# Patient Record
Sex: Female | Born: 1975 | Race: White | Hispanic: No | Marital: Married | Smoking: Never smoker
Health system: Southern US, Community
[De-identification: ages and names within clinical notes are randomized; demographics above are authoritative.]

## PROBLEM LIST (undated history)

## (undated) DIAGNOSIS — H269 Unspecified cataract: Secondary | ICD-10-CM

## (undated) DIAGNOSIS — H43399 Other vitreous opacities, unspecified eye: Secondary | ICD-10-CM

## (undated) DIAGNOSIS — H04129 Dry eye syndrome of unspecified lacrimal gland: Secondary | ICD-10-CM

## (undated) DIAGNOSIS — H40009 Preglaucoma, unspecified, unspecified eye: Secondary | ICD-10-CM

## (undated) DIAGNOSIS — J45909 Unspecified asthma, uncomplicated: Secondary | ICD-10-CM

## (undated) DIAGNOSIS — G2579 Other drug induced movement disorders: Secondary | ICD-10-CM

## (undated) DIAGNOSIS — E785 Hyperlipidemia, unspecified: Secondary | ICD-10-CM

## (undated) DIAGNOSIS — F329 Major depressive disorder, single episode, unspecified: Secondary | ICD-10-CM

## (undated) HISTORY — DX: Major depressive disorder, single episode, unspecified: F32.9

## (undated) HISTORY — DX: Unspecified asthma, uncomplicated: J45.909

## (undated) HISTORY — DX: Hyperlipidemia, unspecified: E78.5

## (undated) HISTORY — PX: OTHER SURGICAL HISTORY: SHX169

## (undated) HISTORY — DX: Other drug induced movement disorders: G25.79

## (undated) HISTORY — DX: Preglaucoma, unspecified, unspecified eye: H40.009

## (undated) HISTORY — DX: Unspecified cataract: H26.9

## (undated) HISTORY — DX: Dry eye syndrome of unspecified lacrimal gland: H04.129

## (undated) HISTORY — DX: Other vitreous opacities, unspecified eye: H43.399

## (undated) MED ORDER — METOPROLOL TARTRATE 5 MG/5ML IV SOLN
5.0000 mg | INTRAVENOUS | Status: AC | PRN
Start: 2021-09-19 — End: ?

## (undated) MED ORDER — PERFLUTREN LIPID MICROSPHERE 1.1 MG/ML IV SUSP
0.4000 mL | Freq: Once | INTRAVENOUS | Status: AC
Start: 2021-02-03 — End: 2021-02-03

## (undated) MED ORDER — NITROGLYCERIN 0.4 MG SL SUBL
0.4000 mg | SUBLINGUAL_TABLET | SUBLINGUAL | Status: AC | PRN
Start: 2021-09-19 — End: ?

## (undated) MED ORDER — SODIUM CHLORIDE FLUSH 0.9 % IV SOLN
10.0000 mL | Freq: Once | INTRAVENOUS | Status: AC
Start: 2021-02-03 — End: 2021-02-03

---

## 1997-01-15 HISTORY — PX: NASAL SINUS SURGERY: SHX719

## 2010-02-13 LAB — PULMONARY FUNCTION TEST

## 2011-12-21 LAB — VITAMIN D 1,25 DIHYDROXY: Vit D, 25-Hydroxy: 26.2

## 2011-12-21 LAB — BASIC METABOLIC PANEL: Creatinine: 0.8 mg/dL (ref 0.5–1.1)

## 2011-12-21 LAB — CBC AND DIFFERENTIAL
HCT: 42 % (ref 36–46)
Hemoglobin: 14.3 g/dL (ref 12.0–16.0)
Platelets: 263 10*3/uL (ref 150–399)

## 2011-12-21 LAB — TSH: TSH: 1.15 u[IU]/mL (ref 0.41–5.90)

## 2012-08-05 ENCOUNTER — Encounter: Payer: Self-pay | Admitting: Family Medicine

## 2012-08-05 ENCOUNTER — Ambulatory Visit (INDEPENDENT_AMBULATORY_CARE_PROVIDER_SITE_OTHER): Payer: 59 | Admitting: Family Medicine

## 2012-08-05 VITALS — BP 112/69 | HR 60 | Temp 98.1°F | Ht 65.5 in | Wt 147.0 lb

## 2012-08-05 DIAGNOSIS — G2579 Other drug induced movement disorders: Secondary | ICD-10-CM

## 2012-08-05 DIAGNOSIS — A499 Bacterial infection, unspecified: Secondary | ICD-10-CM

## 2012-08-05 DIAGNOSIS — G9081 Serotonin syndrome: Secondary | ICD-10-CM

## 2012-08-05 DIAGNOSIS — J329 Chronic sinusitis, unspecified: Secondary | ICD-10-CM

## 2012-08-05 DIAGNOSIS — B9689 Other specified bacterial agents as the cause of diseases classified elsewhere: Secondary | ICD-10-CM

## 2012-08-05 DIAGNOSIS — N63 Unspecified lump in unspecified breast: Secondary | ICD-10-CM

## 2012-08-05 DIAGNOSIS — N6310 Unspecified lump in the right breast, unspecified quadrant: Secondary | ICD-10-CM | POA: Insufficient documentation

## 2012-08-05 DIAGNOSIS — B373 Candidiasis of vulva and vagina: Secondary | ICD-10-CM

## 2012-08-05 DIAGNOSIS — B3731 Acute candidiasis of vulva and vagina: Secondary | ICD-10-CM

## 2012-08-05 HISTORY — DX: Serotonin syndrome: G90.81

## 2012-08-05 HISTORY — DX: Other drug induced movement disorders: G25.79

## 2012-08-05 MED ORDER — FLUCONAZOLE 150 MG PO TABS: ORAL_TABLET | ORAL | Status: AC

## 2012-08-05 MED ORDER — AMOXICILLIN-POT CLAVULANATE 500-125 MG PO TABS: ORAL_TABLET | ORAL | Status: DC

## 2012-08-05 NOTE — Progress Notes (Signed)
CC: Dana Gomez is a 37 y.o. 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 Is been worsening on a daily basis. Slightly improved with Afrin and Alka-Seltzer nothing else makes better or worse. She feels fatigued but denies fevers, chills nor motor or sensory disturbances  Patient complains of a mass found in her right breast approximately 3-4 weeks ago. It is slightly tender localized medially near the breast bone she is never felt this before. Family history of breast cancer in grandmother no ovarian or breast cancer in immediate family. She denies unintentional weight loss nor swollen lymph nodes. She denies breast architectural changes, nipple discharge, retraction, nor skin changes anywhere in the right breast no interventions as of yet   Review Of Systems Outlined In HPI  Past Medical History  Diagnosis Date  . 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 Breast cancer Maternal Grandmother   . Hyperlipidemia Mother      History  Substance Use Topics  . Smoking status: Never Smoker   . Smokeless tobacco: Not on file  . Alcohol Use: Yes     Objective: Filed Vitals:   08/05/12 1432  BP: 112/69  Pulse: 60  Temp: 98.1 F (36.7 C)    General: Alert and Oriented, No Acute Distress HEENT: Pupils equal, round, reactive to light. Conjunctivae clear.  External ears unremarkable, canals clear with intact TMs with appropriate landmarks.  Middle ear appears open without effusion. Boggy inferior turbinates.  Moist mucous membranes, pharynx without inflammation nor lesions.  Neck supple without  palpable lymphadenopathy nor abnormal masses. Left maxillary sinus tenderness to percussion Lungs: Clear to auscultation bilaterally, no wheezing/ronchi/rales.  Comfortable work of breathing. Good air movement. Cardiac: Regular rate and rhythm. Normal S1/S2.  No murmurs, rubs, nor gallops.   Breast: Dana Gomez chaperone present. Right axilla and supraclavicular are without palpable lymphadenopathy. At approximately 3 fingerbreadths medially from the nipple on the right breast there is a approximately half centimeter slightly tender mobile firm mass superficial to one of the rib heads without overlying skin changes Extremities: No peripheral edema.  Strong peripheral pulses.  Mental Status: No depression, anxiety, nor agitation. Skin: Warm and dry.  Assessment & Plan: Dana Gomez was seen today for establish care and left ear feels full.  Diagnoses and associated orders for this visit:  Breast mass, right - MM Digital Screening Unilat R; Future  Bacterial sinusitis - amoxicillin-clavulanate (AUGMENTIN) 500-125 MG per tablet; Take one by mouth every 8 hours for ten total days.  Candida vaginitis - fluconazole (DIFLUCAN) 150 MG tablet; Take one tab, may take second tab if no improvement after 72 hours.    Bacterial sinusitis: Start Augmentin continue Alka-Seltzer cold and sinus hold off on afrin. Breast mass: We discussed risks and benefits of screening for breast cancer in a young female with major risk being unnecessary testing for false positive suspicious results if we were to get a mammogram. Joint decision was made to obtain mammogram to screen for oncologic process, I've asked her to call me in the next week if she has not gotten an appointment  for this.  Return if symptoms worsen or fail to improve.

## 2012-08-11 ENCOUNTER — Telehealth: Payer: Self-pay | Admitting: *Deleted

## 2012-08-11 DIAGNOSIS — N63 Unspecified lump in unspecified breast: Secondary | ICD-10-CM

## 2012-08-11 NOTE — Telephone Encounter (Signed)
Pt called and states she is not feeling any better since she was last seen in the office. She has taken the abx rx'ed for sinus infection for seven days. I suggested that she finish out the abx and if by thurs she wasn't feeling any better to call back. Pt states for FYI she has a long hx of sinus infections and she has been on stronger abx. She feels the augmentin is not helping.She has a business trip Thurs and will not be able to come in

## 2012-08-11 NOTE — Telephone Encounter (Signed)
Helen with Imaging calls and states that they do not do unilateral breast exams there that it will have to be done at [facility] in [place] and pt can schedule at her convience. Put correct order in and notified pt with the number of facility to call them and schedule

## 2012-08-12 ENCOUNTER — Other Ambulatory Visit: Payer: Self-pay | Admitting: Family Medicine

## 2012-08-12 DIAGNOSIS — N63 Unspecified lump in unspecified breast: Secondary | ICD-10-CM

## 2012-08-12 MED ORDER — LEVOFLOXACIN 500 MG PO TABS: 500.0000 mg | ORAL_TABLET | Freq: Every day | ORAL | Status: AC

## 2012-08-12 NOTE — Telephone Encounter (Signed)
Ok will change ABX. If not better by Monday then will need to f/u with OV

## 2012-08-12 NOTE — Addendum Note (Signed)
Addended by: Nani Gasser D on: 08/12/2012 12:07 PM   Modules accepted: Orders, Medications

## 2012-08-12 NOTE — Telephone Encounter (Signed)
Pt was notified earlier today.

## 2012-08-19 ENCOUNTER — Telehealth: Payer: Self-pay | Admitting: *Deleted

## 2012-08-19 DIAGNOSIS — J329 Chronic sinusitis, unspecified: Secondary | ICD-10-CM

## 2012-08-19 MED ORDER — PREDNISONE 20 MG PO TABS: ORAL_TABLET | ORAL | Status: AC

## 2012-08-19 NOTE — Telephone Encounter (Signed)
Dana Gomez, Will you please let Ashleymarie know that if levaquin didn't do the trick I would recommend an ENT referral which I have placed.  In the meantime, if she wants to try a 5 day course of prednisone I would expect this to attack any lingering inflammation in her sinus passages. I have sent this to her pharmacy on file.

## 2012-08-19 NOTE — Telephone Encounter (Signed)
Pt left a message stating that she has been treated for a sinus infection with a couple different antibiotics but she still feels like her left ear & her left sinuses are still clogged.  Would you like for her to be seen again or can we call her in something else in? Please advise

## 2012-08-20 ENCOUNTER — Encounter: Payer: Self-pay | Admitting: Family Medicine

## 2012-08-20 ENCOUNTER — Ambulatory Visit (INDEPENDENT_AMBULATORY_CARE_PROVIDER_SITE_OTHER): Payer: 59 | Admitting: Family Medicine

## 2012-08-20 VITALS — BP 115/72 | HR 51 | Wt 143.0 lb

## 2012-08-20 DIAGNOSIS — J309 Allergic rhinitis, unspecified: Secondary | ICD-10-CM

## 2012-08-20 DIAGNOSIS — R5383 Other fatigue: Secondary | ICD-10-CM

## 2012-08-20 MED ORDER — FLUTICASONE PROPIONATE 50 MCG/ACT NA SUSP: NASAL | Status: DC

## 2012-08-20 NOTE — Progress Notes (Signed)
CC: Dana Gomez is a 37 y.o. female is here for Breast Mass   Subjective: HPI:  Patient complains of 3 weeks of fatigue that has been present ever since sinus infection began. She describes this as moderate lack of energy in the morning mild lack of imaging the afternoon moderate to severe lack of energy in the evenings. She has energy and motivation to complete tasks at work however nothing gets done before or after work such as shopping housework or even her love of exercising. Symptoms have not gotten better or worse since onset. Nothing seems to make them better or worse. She denies history of thyroid or vitamin abnormality nor anemia. She does report history of fluctuating subclinical depression but admits she has nothing to be happy about and truly she is very happy with life right now. Denies mental disturbance anxiety nor depression  Patient plans to continue left nasal stuffiness and congestion associated with ear fullness without hearing loss nor tinnitus. It is mild to moderate in severity and has been present ever since sinus infection 3 weeks ago. She is awaiting to decide whether or not going to ENT. Symptoms were significantly improved with Levaquin however she still feels mild/moderate discomfort. Denies cough, shortness of breath, fevers, chills, dysphagia, chest pain    Review Of Systems Outlined In HPI  Past Medical History  Diagnosis Date  . 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 Breast cancer Maternal Grandmother   . Hyperlipidemia Mother      History  Substance Use Topics  . Smoking status: Never Smoker   . Smokeless tobacco: Not on file  . Alcohol Use: Yes     Objective: Filed Vitals:   08/20/12 1556  BP: 115/72  Pulse: 51    General: Alert and Oriented, No Acute Distress HEENT: Pupils equal, round, reactive to light. Conjunctivae clear.  External ears unremarkable, canals clear with intact TMs with  appropriate landmarks.  Middle ear appears open without effusion. Moderately erythematous left inferior turbinate right unremarkable  Moist mucous membranes, pharynx without inflammation nor lesions.  Neck supple without palpable lymphadenopathy nor abnormal masses. Lungs: Clear to auscultation bilaterally, no wheezing/ronchi/rales.  Comfortable work of breathing. Good air movement. Cardiac: Regular rate and rhythm. Normal S1/S2.  No murmurs, rubs, nor gallops.   Extremities: No peripheral edema.  Strong peripheral pulses.  Mental Status: No depression, anxiety, nor agitation. Skin: Warm and dry.  Assessment & Plan: Sitlali was seen today for breast mass.  Diagnoses and associated orders for this visit:  Allergic sinusitis - fluticasone (FLONASE) 50 MCG/ACT nasal spray; Two sprays each nostril daily.  Fatigue    Fatigue: Patient and I suspect this may be due to overwhelming feelings of uncertainty with regard to her breast lump awaiting imaging next Wednesday, I encouraged her to have TSH B12 vitamin D and hemoglobin done however she would like to wait until imaging is performed. Allergic sinusitis: She will hold off on starting prednisone prescription from telephone encounter yesterday instead start Flonase if no improvement by Wednesday next week she will go through with ENT referral  25 minutes spent face-to-face during visit today of which at least 50% was counseling or coordinating care regarding fatigue and allergic sinusitis.   Return if symptoms worsen or fail to improve.

## 2012-08-20 NOTE — Telephone Encounter (Signed)
Left detailed message on vm.

## 2012-08-21 ENCOUNTER — Encounter: Payer: Self-pay | Admitting: Family Medicine

## 2012-08-22 ENCOUNTER — Telehealth: Payer: Self-pay | Admitting: Family Medicine

## 2012-08-22 DIAGNOSIS — E559 Vitamin D deficiency, unspecified: Secondary | ICD-10-CM | POA: Insufficient documentation

## 2012-08-22 MED ORDER — VITAMIN D (ERGOCALCIFEROL) 1.25 MG (50000 UNIT) PO CAPS: 50000.0000 [IU] | ORAL_CAPSULE | ORAL | Status: DC

## 2012-08-22 NOTE — Telephone Encounter (Signed)
Pt notified of lab results, she asked me if there was any mention of lyme's results(?) she said she was concerned because they were high. I told her her that you didn't mention this so I assume labs were ok as you stated but she wanted me to ask you

## 2012-08-22 NOTE — Telephone Encounter (Signed)
The Lyme IgG/IgM test confirms that she has been exposed to lyme disease sometime in her life, there are other ways to look at IgG and IgM separately to determine if there is a new Lyme disease  Infection where IgM would be elevated or if there was only a past exposure where only IgG would be elevated.  IgG can remain elevated for years after exposure and treatment.  She likely has already had IgM and IgG tested individually if the ratio was elevated. If not, I"d be happy to arrange this testing.

## 2012-08-22 NOTE — Telephone Encounter (Signed)
[name], Will you please let Mrs. Beedy know that the lab values she sent me are significant for a vitamin D deficiency which certainly can contribute to fatigue and puts her at risk of developing osteoporosis in the distant future.  I'd encourage her to start a once a week vitamin d supplement for the next 3 months, this was sent to the [facility] on [address].  Otherwise her labs were not significant.

## 2012-08-22 NOTE — Telephone Encounter (Signed)
Left detailed message on pt's vm with below

## 2012-08-25 ENCOUNTER — Telehealth: Payer: Self-pay | Admitting: *Deleted

## 2012-08-25 DIAGNOSIS — R768 Other specified abnormal immunological findings in serum: Secondary | ICD-10-CM

## 2012-08-25 NOTE — Telephone Encounter (Signed)
Pt called back and she would like to go ahead and have additional blood work done for the [employer] disease

## 2012-08-25 NOTE — Telephone Encounter (Signed)
Pt.notified

## 2012-08-25 NOTE — Telephone Encounter (Signed)
Dana Gomez, Will you please let her know i've placed this order up front for her to do at her convenience.

## 2012-08-27 ENCOUNTER — Ambulatory Visit
Admission: RE | Admit: 2012-08-27 | Discharge: 2012-08-27 | Disposition: A | Discharge status: Home / Self Care | Payer: 59 | Source: Ambulatory Visit | Attending: Family Medicine | Admitting: Family Medicine

## 2012-08-27 ENCOUNTER — Other Ambulatory Visit: Payer: Self-pay | Admitting: Family Medicine

## 2012-08-27 ENCOUNTER — Encounter: Payer: Self-pay | Admitting: Family Medicine

## 2012-08-27 DIAGNOSIS — N63 Unspecified lump in unspecified breast: Secondary | ICD-10-CM

## 2012-08-28 LAB — LYME ABY, WSTRN BLT IGG & IGM W/BANDS
Lyme Disease 28 kD IgG: NONREACTIVE
Lyme Disease 30 kD IgG: REACTIVE — AB
Lyme Disease 39 kD IgG: NONREACTIVE
Lyme Disease 41 kD IgG: REACTIVE — AB
Lyme Disease 41 kD IgM: NONREACTIVE
Lyme Disease 45 kD IgG: NONREACTIVE
Lyme Disease 58 kD IgG: NONREACTIVE

## 2012-09-01 ENCOUNTER — Encounter: Payer: Self-pay | Admitting: Family Medicine

## 2012-09-01 DIAGNOSIS — IMO0001 Reserved for inherently not codable concepts without codable children: Secondary | ICD-10-CM | POA: Insufficient documentation

## 2012-09-01 DIAGNOSIS — R05 Cough: Secondary | ICD-10-CM | POA: Insufficient documentation

## 2012-09-01 DIAGNOSIS — Z8619 Personal history of other infectious and parasitic diseases: Secondary | ICD-10-CM | POA: Insufficient documentation

## 2012-09-01 DIAGNOSIS — R058 Other specified cough: Secondary | ICD-10-CM | POA: Insufficient documentation

## 2012-09-01 DIAGNOSIS — R030 Elevated blood-pressure reading, without diagnosis of hypertension: Secondary | ICD-10-CM

## 2012-09-01 DIAGNOSIS — B009 Herpesviral infection, unspecified: Secondary | ICD-10-CM | POA: Insufficient documentation

## 2012-09-01 DIAGNOSIS — Z1159 Encounter for screening for other viral diseases: Secondary | ICD-10-CM

## 2012-09-03 ENCOUNTER — Encounter: Payer: Self-pay | Admitting: *Deleted

## 2012-11-20 ENCOUNTER — Other Ambulatory Visit: Payer: Self-pay

## 2012-12-04 ENCOUNTER — Encounter: Payer: Self-pay | Admitting: Family Medicine

## 2012-12-04 ENCOUNTER — Other Ambulatory Visit: Payer: Self-pay | Admitting: Physician Assistant

## 2012-12-04 DIAGNOSIS — E559 Vitamin D deficiency, unspecified: Secondary | ICD-10-CM

## 2013-02-10 ENCOUNTER — Encounter: Payer: Self-pay | Admitting: Family Medicine

## 2013-02-10 ENCOUNTER — Ambulatory Visit (INDEPENDENT_AMBULATORY_CARE_PROVIDER_SITE_OTHER): Payer: 59 | Admitting: Family Medicine

## 2013-02-10 VITALS — BP 115/73 | HR 61 | Temp 97.4°F | Wt 151.0 lb

## 2013-02-10 DIAGNOSIS — A499 Bacterial infection, unspecified: Secondary | ICD-10-CM

## 2013-02-10 DIAGNOSIS — J329 Chronic sinusitis, unspecified: Secondary | ICD-10-CM

## 2013-02-10 DIAGNOSIS — B9689 Other specified bacterial agents as the cause of diseases classified elsewhere: Secondary | ICD-10-CM

## 2013-02-10 MED ORDER — DOXYCYCLINE HYCLATE 100 MG PO TABS: ORAL_TABLET | ORAL | Status: AC

## 2013-02-10 MED ORDER — FLUCONAZOLE 150 MG PO TABS: ORAL_TABLET | ORAL | Status: AC

## 2013-02-10 NOTE — Progress Notes (Signed)
CC: Dana Gomez is a 38 y.o. female is here for URI   Subjective: HPI:  Complains of one week of facial pressure localizes beneath both eyes and above both eyes it is nonradiating described only as pressure moderate to severe in severity. Worse with exertion and with leaning over or with loud noises. No other motor or sensory disturbances. Accompanied by nasal congestion mild in severity with a productive cough but does not contain blood and sputum. Denies shortness of breath. Fatigue has bothered her to the point were she stopped her daily exercise routine. Mucinex helps somewhat no other interventions nothing particular makes better or worse otherwise. Denies fevers, chills, shortness of breath, chest pain, hearing loss or vision disturbance   Review Of Systems Outlined In HPI  Past Medical History  Diagnosis Date  . 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 Breast cancer Maternal Grandmother   . Hyperlipidemia Mother   . Alcoholism Mother   . Alcoholism Father      History  Substance Use Topics  . Smoking status: Never Smoker   . Smokeless tobacco: Not on file  . Alcohol Use: Yes     Objective: Filed Vitals:   02/10/13 0909  BP: 115/73  Pulse: 61  Temp: 97.4 F (36.3 C)    General: Alert and Oriented, No Acute Distress HEENT: Pupils equal, round, reactive to light. Conjunctivae clear.  External ears unremarkable, canals clear with intact TMs with appropriate landmarks.  Middle ear appears open without effusion. Pink inferior turbinates.  Moist mucous membranes, pharynx without inflammation nor lesions however moderate cobblestoning and postnasal drip.  Neck supple without palpable lymphadenopathy nor abnormal masses. Lungs: Clear to auscultation bilaterally, no wheezing/ronchi/rales.  Comfortable work of breathing. Good air movement. Cardiac: Regular rate and rhythm. Normal S1/S2.  No murmurs, rubs, nor gallops.    Mental Status: No depression, anxiety, nor agitation. Skin: Warm and dry.  Assessment & Plan: [name] was seen today for uri.  Diagnoses and associated orders for this visit:  Bacterial sinusitis - fluconazole (DIFLUCAN) 150 MG tablet; Take one tab, may take second tab if no improvement after 72 hours. - doxycycline (VIBRA-TABS) 100 MG tablet; One by mouth twice a day for ten days.    Bacterial sinusitis: Start doxycycline, she has a history of yeast infections after taking antibiotics therefore Diflucan available if necessary. Continue Mucinex products and relative rest  Return if symptoms worsen or fail to improve.

## 2013-02-13 ENCOUNTER — Encounter: Payer: Self-pay | Admitting: Family Medicine

## 2013-02-16 MED ORDER — HALOBETASOL PROPIONATE 0.05 % EX CREA: TOPICAL_CREAM | Freq: Two times a day (BID) | CUTANEOUS | Status: DC

## 2013-02-16 MED ORDER — HALOBETASOL PROPIONATE 0.05 % EX OINT: TOPICAL_OINTMENT | Freq: Two times a day (BID) | CUTANEOUS | Status: DC

## 2013-02-16 NOTE — Addendum Note (Signed)
Addended by: Laren BoomHOMMEL, Donica Derouin on: 02/16/2013 10:31 AM   Modules accepted: Orders

## 2013-02-24 ENCOUNTER — Encounter: Payer: Self-pay | Admitting: Family Medicine

## 2013-02-26 ENCOUNTER — Encounter: Payer: Self-pay | Admitting: Family Medicine

## 2013-02-26 ENCOUNTER — Ambulatory Visit (INDEPENDENT_AMBULATORY_CARE_PROVIDER_SITE_OTHER): Payer: 59 | Admitting: Family Medicine

## 2013-02-26 VITALS — BP 114/70 | HR 49 | Wt 154.0 lb

## 2013-02-26 DIAGNOSIS — L259 Unspecified contact dermatitis, unspecified cause: Secondary | ICD-10-CM

## 2013-02-26 DIAGNOSIS — R32 Unspecified urinary incontinence: Secondary | ICD-10-CM

## 2013-02-26 DIAGNOSIS — L309 Dermatitis, unspecified: Secondary | ICD-10-CM

## 2013-02-26 MED ORDER — CLOTRIMAZOLE-BETAMETHASONE 1-0.05 % EX CREA: TOPICAL_CREAM | CUTANEOUS | Status: AC

## 2013-02-26 NOTE — Progress Notes (Addendum)
CC: Dana Gomez is a 38 y.o. female is here for rash on the chest and back   Subjective: HPI:  Complains of a rash on her chest and back that has been present for the past 3-5 days that began on the last few days of her taking doxycycline. She states that she has this rash frequently when taking medication such as azithromycin. Rash was also localized on the face but now resolved, she has taken 2 doses of fluconazole however rash on chest has not improved. It is painless and does not itch. She denies rashes elsewhere on the body. Denies fevers, chills, flushing, shortness of breath, chest pain, rapid heart beat nor any feeling of unwell  She reports a history of bladder leakage during strenuous activity that has been present for years. This is been without frequency urgency nor dysuria. No evaluation as of yet. Symptoms are worse with running, biking, or lifting weights. Symptoms improved she focuses on rocking her hips back nothing else makes better or worse. Symptoms are mild-to-moderate in severity and has been present for years. Unchanged over these years.   Review Of Systems Outlined In HPI  Past Medical History  Diagnosis Date  . 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 Schleral buckle  [id number]  . Nasal sinus surgery  1999   Family History  Problem Relation Age of Onset  . Breast cancer Maternal Grandmother   . Hyperlipidemia Mother   . Alcoholism Mother   . Alcoholism Father     History   Social History  . Marital Status: Single    Spouse Name: N/A    Number of Children: N/A  . Years of Education: N/A   Occupational History  . Not on file.   Social History Main Topics  . Smoking status: Never Smoker   . Smokeless tobacco: Not on file  . Alcohol Use: Yes  . Drug Use: No  . Sexual Activity: Yes   Other Topics Concern  . Not on file   Social History Narrative  . No narrative on file     Objective: BP  114/70  Pulse 49  Wt 154 lb (69.854 kg)  General: Alert and Oriented, No Acute Distress HEENT: Pupils equal, round, reactive to light. Conjunctivae clear.  Moist his membranes ears unremarkable Lungs: Clear and comfortable work of breathing Cardiac: Regular rate and rhythm.  Extremities: No peripheral edema.  Strong peripheral pulses.  Mental Status: No depression, anxiety, nor agitation. Skin: Warm and dry. Macular papular rash on the chest and back between the shoulder blades and in the lumbar region sparing skin folds on the torso.  Assessment & Plan: [name] was seen today for rash on the chest and back.  Diagnoses and associated orders for this visit:  Bladder leak  Dermatitis - clotrimazole-betamethasone (LOTRISONE) cream; Apply to affected area twice a day for two weeks, may require four weeks if involving the feet/toes.    Dermatitis: Low suspicion for allergic reaction due to medication such there is no itch component, start Lotrisone and contact me if not improving in one week. Bladder leakage: Discussed key goal exercises and if not improving after 2-4 weeks we can refer to urology or pelvic physical therapy  25minutes spent face-to-face during visit today of which at least 50% was counseling or coordinating care regarding: 1. Bladder leak   2. Dermatitis      Return if symptoms worsen or fail to improve.

## 2013-03-13 ENCOUNTER — Encounter: Payer: Self-pay | Admitting: Family Medicine

## 2013-03-20 ENCOUNTER — Ambulatory Visit: Payer: 59 | Admitting: Family Medicine

## 2013-03-24 ENCOUNTER — Ambulatory Visit: Payer: 59 | Admitting: Family Medicine

## 2013-03-30 ENCOUNTER — Ambulatory Visit: Payer: 59 | Admitting: Family Medicine

## 2013-04-01 ENCOUNTER — Encounter: Payer: Self-pay | Admitting: Family Medicine

## 2013-04-01 ENCOUNTER — Ambulatory Visit (INDEPENDENT_AMBULATORY_CARE_PROVIDER_SITE_OTHER): Payer: 59 | Admitting: Family Medicine

## 2013-04-01 ENCOUNTER — Telehealth: Payer: Self-pay | Admitting: *Deleted

## 2013-04-01 VITALS — BP 122/74 | HR 59 | Wt 153.0 lb

## 2013-04-01 DIAGNOSIS — IMO0001 Reserved for inherently not codable concepts without codable children: Secondary | ICD-10-CM

## 2013-04-01 DIAGNOSIS — M6281 Muscle weakness (generalized): Secondary | ICD-10-CM

## 2013-04-01 LAB — COMPLETE METABOLIC PANEL WITH GFR
ALK PHOS: 44 U/L (ref 39–117)
ALT: 26 U/L (ref 0–35)
AST: 20 U/L (ref 0–37)
Albumin: 4.5 g/dL (ref 3.5–5.2)
BILIRUBIN TOTAL: 1.3 mg/dL — AB (ref 0.2–1.2)
BUN: 18 mg/dL (ref 6–23)
CO2: 29 mEq/L (ref 19–32)
CREATININE: 0.7 mg/dL (ref 0.50–1.10)
Calcium: 9.3 mg/dL (ref 8.4–10.5)
Chloride: 102 mEq/L (ref 96–112)
GFR, Est African American: >89 mL/min
GLUCOSE: 65 mg/dL — AB (ref 70–99)
Potassium: 4.2 mEq/L (ref 3.5–5.3)
SODIUM: 137 meq/L (ref 135–145)
TOTAL PROTEIN: 6.5 g/dL (ref 6.0–8.3)

## 2013-04-01 LAB — CK: Total CK: 72 U/L (ref 7–177)

## 2013-04-01 LAB — TSH: TSH: 0.87 u[IU]/mL (ref 0.350–4.500)
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
## 2013-04-02 ENCOUNTER — Encounter: Payer: Self-pay | Admitting: Family Medicine

## 2013-04-02 LAB — URINALYSIS
Bilirubin Urine: NEGATIVE
Glucose, UA: NEGATIVE mg/dL
Hgb urine dipstick: NEGATIVE
KETONES UR: NEGATIVE mg/dL
Leukocytes, UA: NEGATIVE
NITRITE: NEGATIVE
PH: 5.5 (ref 5.0–8.0)
Protein, ur: NEGATIVE mg/dL
SPECIFIC GRAVITY, URINE: 1.017 (ref 1.005–1.030)
UROBILINOGEN UA: 0.2 mg/dL (ref 0.0–1.0)

## 2013-04-03 ENCOUNTER — Encounter: Payer: Self-pay | Admitting: Sports Medicine

## 2013-04-03 ENCOUNTER — Ambulatory Visit (INDEPENDENT_AMBULATORY_CARE_PROVIDER_SITE_OTHER): Payer: 59 | Admitting: Sports Medicine

## 2013-04-03 VITALS — BP 117/72 | HR 55 | Ht 65.0 in | Wt 153.0 lb

## 2013-04-03 DIAGNOSIS — M79609 Pain in unspecified limb: Secondary | ICD-10-CM

## 2013-04-03 DIAGNOSIS — M79606 Pain in leg, unspecified: Secondary | ICD-10-CM | POA: Insufficient documentation

## 2013-04-03 NOTE — Assessment & Plan Note (Addendum)
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 She was seen by Dr[facility] Mo[facility], was unable to complete nerve conduction studies. She also endorses early fatigability with anaerobic exercise. My differential diagnoses include lumbar radiculitis, iliac artery endofibrosis, and metabolic causes. We will start with a metabolic workup, of note she did have an elevated Lyme titer in 2013, she was not treated.  We will touch base on the phone regarding blood work, and if continues to have symptoms and blood work is negative we will proceed with an epidural. Last step if all negative would be iliac artery angiogram. Certainly we could consider creatine supplementation in the future.

## 2013-04-03 NOTE — Patient Instructions (Signed)
My differential diagnoses include lumbar radiculitis, iliac artery endofibrosis, and metabolic causes.

## 2013-04-03 NOTE — Progress Notes (Signed)
   Subjective:    I'm seeing this patient as a consultation for:  Dr. Ivan AnchorsHommel  CC: Leg pain  HPI: This is a very pleasant 38 year old female, she is an extensive crossfitter, for the past several months she has had pain in her low back radiating down the left leg in a lower lumbar/upper sacral distribution. She has been seeing Dr[facility] Regino [facility]ultzeWang [facility] who placed her to physical therapy, NSAIDs, steroids, muscle relaxers, nothing worse, so a lumbar spine MRI was done, she brings the DVD in today. She was then started with nerve conduction/EMG, but unfortunately was unable to continue to test due to discomfort. On further questioning, she has very early fatigability with symptoms predominantly in both quadriceps, occasionally with pain radiating down below the knees, but predominately with a stinging and burning sensation in the anterior thighs. She did have some blood work done initially with a normal CK level, aldolase levels are still pending. She was also on birth control initially but does tell her that since she has stopped, her periods have become significantly longer and more voluminous. She did have a normal hemoglobin recently but has not yet had ferritin levels. Also on further review of her chart she did have a positive Lyme titer from 2013, this was never treated. Symptoms are moderate, persistent.  Past medical history, Surgical history, Family history not pertinant except as noted below, Social history, Allergies, and medications have been entered into the medical record, reviewed, and no changes needed.   Review of Systems: No headache, visual changes, nausea, vomiting, diarrhea, constipation, dizziness, abdominal pain, skin rash, fevers, chills, night sweats, weight loss, swollen lymph nodes, body aches, joint swelling, muscle aches, chest pain, shortness of breath, mood changes, visual or auditory hallucinations.   Objective:   General: Well Developed, well nourished, and in no  acute distress.  Neuro/Psych: Alert and oriented x3, extra-ocular muscles intact, able to move all 4 extremities, sensation grossly intact. Skin: Warm and dry, no rashes noted.  Respiratory: Not using accessory muscles, speaking in full sentences, trachea midline.  Cardiovascular: Pulses palpable, no extremity edema. Abdomen: Does not appear distended. Back Exam:  Inspection: Unremarkable  Motion: Flexion 45 deg, Extension 45 deg, Side Bending to 45 deg bilaterally,  Rotation to 45 deg bilaterally  SLR laying: Negative  XSLR laying: Negative  Palpable tenderness: None. FABER: negative. Sensory change: Gross sensation intact to all lumbar and sacral dermatomes.  Reflexes: 2+ at both patellar tendons, 2+ at achilles tendons, Babinski's downgoing.  Strength at foot  Plantar-flexion: 5/5 Dorsi-flexion: 5/5 Eversion: 5/5 Inversion: 5/5  Leg strength  Quad: 5/5 Hamstring: 5/5 Hip flexor: 5/5 Hip abductors: 5/5  Gait unremarkable.  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  Impression and Recommendations:   This case required medical decision making of moderate complexity.

## 2013-04-04 LAB — CBC
HCT: 41.4 % (ref 36.0–46.0)
Hemoglobin: 14.3 g/dL (ref 12.0–15.0)
MCH: 29.4 pg (ref 26.0–34.0)
MCHC: 34.5 g/dL (ref 30.0–36.0)
MCV: 85.2 fL (ref 78.0–100.0)
Platelets: 228 K/uL (ref 150–400)
RBC: 4.86 MIL/uL (ref 3.87–5.11)
RDW: 13.2 % (ref 11.5–15.5)
WBC: 8.4 K/uL (ref 4.0–10.5)

## 2013-04-04 LAB — COMPREHENSIVE METABOLIC PANEL
Alkaline Phosphatase: 48 U/L (ref 39–117)
BUN: 22 mg/dL (ref 6–23)
Creat: 0.69 mg/dL (ref 0.50–1.10)
Glucose, Bld: 73 mg/dL (ref 70–99)
Total Bilirubin: 1.2 mg/dL (ref 0.2–1.2)

## 2013-04-04 LAB — COMPREHENSIVE METABOLIC PANEL WITH GFR
ALT: 18 U/L (ref 0–35)
AST: 16 U/L (ref 0–37)
Albumin: 4.6 g/dL (ref 3.5–5.2)
CO2: 24 meq/L (ref 19–32)
Calcium: 9.6 mg/dL (ref 8.4–10.5)
Chloride: 102 meq/L (ref 96–112)
Potassium: 3.9 meq/L (ref 3.5–5.3)
Sodium: 137 meq/L (ref 135–145)
Total Protein: 6.9 g/dL (ref 6.0–8.3)

## 2013-04-04 LAB — CK: Total CK: 96 U/L (ref 7–177)

## 2013-04-04 LAB — ALDOLASE: ALDOLASE: 4.2 U/L (ref <=8.1)

## 2013-04-04 LAB — IRON AND TIBC
%SAT: 30 % (ref 20–55)
Iron: 116 ug/dL (ref 42–145)
TIBC: 387 ug/dL (ref 250–470)
UIBC: 271 ug/dL (ref 125–400)

## 2013-04-04 LAB — FOLATE: Folate: 17.1 ng/mL

## 2013-04-04 LAB — TSH: TSH: 0.93 u[IU]/mL (ref 0.350–4.500)

## 2013-04-04 LAB — VITAMIN B12: Vitamin B-12: 563 pg/mL (ref 211–911)

## 2013-04-04 LAB — FERRITIN: Ferritin: 22 ng/mL (ref 10–291)

## 2013-04-04 LAB — SEDIMENTATION RATE: Sed Rate: 1 mm/h (ref 0–22)

## 2013-04-04 LAB — RHEUMATOID FACTOR: Rheumatoid fact SerPl-aCnc: <10 [IU]/mL (ref <=14)

## 2013-04-06 LAB — ANA: Anti Nuclear Antibody(ANA): NEGATIVE

## 2013-04-06 LAB — B. BURGDORFI ANTIBODIES: B burgdorferi Ab IgG+IgM: 1.31 {ISR} — ABNORMAL HIGH

## 2013-04-07 ENCOUNTER — Telehealth: Payer: Self-pay

## 2013-04-07 LAB — B. BURGDORFI ANTIBODIES BY WB
B burgdorferi IgG Abs (IB): NEGATIVE
B burgdorferi IgM Abs (IB): NEGATIVE

## 2013-04-07 LAB — EHRLICHIA ANTIBODY PANEL
E chaffeensis (HGE) Ab, IgG: <1:64 {titer}
E chaffeensis (HGE) Ab, IgM: <1:20 {titer}

## 2013-04-07 MED ORDER — FERROUS SULFATE 325 (65 FE) MG PO TBEC: 325.0000 mg | DELAYED_RELEASE_TABLET | Freq: Three times a day (TID) | ORAL | Status: DC

## 2013-04-07 MED ORDER — DOXYCYCLINE HYCLATE 100 MG PO TABS: 100.0000 mg | ORAL_TABLET | Freq: Two times a day (BID) | ORAL | Status: AC

## 2013-04-07 NOTE — Addendum Note (Signed)
Addended by: Monica BectonHEKKEKANDAM, Amour Trigg J on: 04/07/2013 05:01 PM   Modules accepted: Orders

## 2013-04-07 NOTE — Telephone Encounter (Signed)
Patient called requested her lab results from 04/03/2013. Rhonda Cunningham,CMA

## 2013-04-08 LAB — ROCKY MTN SPOTTED FVR ABS PNL(IGG+IGM)
RMSF IgG: 0.11 IV
RMSF IgM: 0.14 IV

## 2013-04-09 ENCOUNTER — Encounter: Payer: Self-pay | Admitting: Sports Medicine

## 2013-04-09 LAB — CORTISOL, FREE: Cortisol Free, Ser: 0.45 ug/dL

## 2013-04-13 ENCOUNTER — Encounter: Payer: Self-pay | Admitting: Sports Medicine

## 2013-04-13 ENCOUNTER — Ambulatory Visit (INDEPENDENT_AMBULATORY_CARE_PROVIDER_SITE_OTHER): Payer: 59 | Admitting: Sports Medicine

## 2013-04-13 VITALS — BP 115/69 | HR 61 | Ht 65.0 in | Wt 155.0 lb

## 2013-04-13 DIAGNOSIS — R5383 Other fatigue: Secondary | ICD-10-CM

## 2013-04-13 DIAGNOSIS — R5381 Other malaise: Secondary | ICD-10-CM

## 2013-04-13 MED ORDER — FLUCONAZOLE 150 MG PO TABS: 150.0000 mg | ORAL_TABLET | Freq: Once | ORAL | Status: DC

## 2013-04-13 NOTE — Assessment & Plan Note (Signed)
Ferritin level was low normal, we are going to treat this aggressively with iron sulfate 325 mg 3 times a day with vitamin C. I will recheck in one month, if it has not increased sufficiently to past 40, we will consider IV iron supplementation. Of course should her ferritin levels be normal, but symptoms be persistent we will further pursue the lumbar spine. 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  Of note she does have an attorney hired to help settle a case.

## 2013-04-13 NOTE — Progress Notes (Signed)
  Subjective:    CC: Followup  HPI: Dana Gomez comes back for followup on blood work regarding her vague anterior thigh early fatigability. She has not yet started her doxycycline or iron.  Past medical history, Surgical history, Family history not pertinant except as noted below, Social history, Allergies, and medications have been entered into the medical record, reviewed, and no changes needed.   Review of Systems: No fevers, chills, night sweats, weight loss, chest pain, or shortness of breath.   Objective:    General: Well Developed, well nourished, and in no acute distress.  Neuro: Alert and oriented x3, extra-ocular muscles intact, sensation grossly intact.  HEENT: Normocephalic, atraumatic, pupils equal round reactive to light, neck supple, no masses, no lymphadenopathy, thyroid nonpalpable.  Skin: Warm and dry, no rashes. Cardiac: Regular rate and rhythm, no murmurs rubs or gallops, no lower extremity edema.  Respiratory: Clear to auscultation bilaterally. Not using accessory muscles, speaking in full sentences.  Impression and Recommendations:    I spent 40 minutes with this patient, greater than 50% was face-to-face time counseling regarding the below diagnoses.

## 2013-04-15 ENCOUNTER — Encounter: Payer: Self-pay | Admitting: Sports Medicine

## 2013-04-21 ENCOUNTER — Ambulatory Visit: Payer: 59 | Admitting: Sports Medicine

## 2013-05-11 ENCOUNTER — Encounter: Payer: Self-pay | Admitting: Sports Medicine

## 2013-05-11 DIAGNOSIS — R5381 Other malaise: Secondary | ICD-10-CM

## 2013-05-11 DIAGNOSIS — R5383 Other fatigue: Secondary | ICD-10-CM

## 2013-05-12 ENCOUNTER — Encounter: Payer: Self-pay | Admitting: Sports Medicine

## 2013-05-13 LAB — VITAMIN B12: Vitamin B-12: 571 pg/mL (ref 211–911)

## 2013-05-13 LAB — CBC
HCT: 41.3 % (ref 36.0–46.0)
Hemoglobin: 14.5 g/dL (ref 12.0–15.0)
MCH: 29.4 pg (ref 26.0–34.0)
MCHC: 35.1 g/dL (ref 30.0–36.0)
MCV: 83.8 fL (ref 78.0–100.0)
Platelets: 241 K/uL (ref 150–400)
RBC: 4.93 MIL/uL (ref 3.87–5.11)
RDW: 13.3 % (ref 11.5–15.5)
WBC: 7.8 K/uL (ref 4.0–10.5)

## 2013-05-13 LAB — FERRITIN: Ferritin: 36 ng/mL (ref 10–291)

## 2013-05-13 LAB — IRON AND TIBC
%SAT: 50 % (ref 20–55)
Iron: 172 ug/dL — ABNORMAL HIGH (ref 42–145)
TIBC: 345 ug/dL (ref 250–470)
UIBC: 173 ug/dL (ref 125–400)

## 2013-05-13 LAB — FOLATE: Folate: 19.7 ng/mL

## 2013-05-13 LAB — VITAMIN D 25 HYDROXY (VIT D DEFICIENCY, FRACTURES): Vit D, 25-Hydroxy: 50 ng/mL (ref 30–89)

## 2013-05-14 ENCOUNTER — Ambulatory Visit (INDEPENDENT_AMBULATORY_CARE_PROVIDER_SITE_OTHER): Payer: 59 | Admitting: Sports Medicine

## 2013-05-14 ENCOUNTER — Encounter: Payer: Self-pay | Admitting: Sports Medicine

## 2013-05-14 VITALS — BP 113/71 | HR 49 | Ht 65.0 in | Wt 153.0 lb

## 2013-05-14 DIAGNOSIS — M79609 Pain in unspecified limb: Secondary | ICD-10-CM

## 2013-05-14 DIAGNOSIS — M79606 Pain in leg, unspecified: Secondary | ICD-10-CM

## 2013-05-14 MED ORDER — GABAPENTIN 300 MG PO CAPS: ORAL_CAPSULE | ORAL | Status: DC

## 2013-05-14 NOTE — Assessment & Plan Note (Signed)
Initial good response to iron supplementation but unfortunately has a recurrence of symptoms. Her symptoms are referrable to both L4 nerve roots, with weakness in the quadriceps, and numbness and tingling down the leg, I do think she does have some L5 symptoms as well. She does have a broad-based disc protrusion at L4-5 with desiccation, causing mild bilateral foraminal stenosis. 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 At this point we are going to proceed with gabapentin, and I would like a bilateral L4-L5 transforaminal epidural injection.

## 2013-05-14 NOTE — Progress Notes (Signed)
  Subjective:    CC: Followup  HPI:  Dana Gomez returns, she is a high level cross fit athlete, for some time now she's had this vague weakness in her quadriceps with numbness and tingling radiating down her leg in both L5 and L4 distribution. Initially we checked some metabolic studies and her ferritin levels were low normal, we repleted this, but unfortunately her symptoms have persisted. She recently did a triathlon, keeping her spine in flexion while riding the bike was extremely uncomfortable and worsen her symptoms. They continue to be moderate, persistent. She has had an MRI the results of which have been dictated below.  Past medical history, Surgical history, Family history not pertinant except as noted below, Social history, Allergies, and medications have been entered into the medical record, reviewed, and no changes needed.   Review of Systems: No fevers, chills, night sweats, weight loss, chest pain, or shortness of breath.   Objective:    General: Well Developed, well nourished, and in no acute distress.  Neuro: Alert and oriented x3, extra-ocular muscles intact, sensation grossly intact.  HEENT: Normocephalic, atraumatic, pupils equal round reactive to light, neck supple, no masses, no lymphadenopathy, thyroid nonpalpable.  Skin: Warm and dry, no rashes. Cardiac: Regular rate and rhythm, no murmurs rubs or gallops, no lower extremity edema.  Respiratory: Clear to auscultation bilaterally. Not using accessory muscles, speaking in full sentences.  We again reviewed the MRI, she has a L4-L5 disc protrusion that is broad based, desiccated, and causing mild bilateral foraminal stenosis.  Impression and Recommendations:

## 2013-05-15 ENCOUNTER — Encounter: Payer: Self-pay | Admitting: Sports Medicine

## 2013-05-15 ENCOUNTER — Other Ambulatory Visit: Payer: Self-pay | Admitting: Sports Medicine

## 2013-05-15 ENCOUNTER — Ambulatory Visit
Admission: RE | Admit: 2013-05-15 | Discharge: 2013-05-15 | Disposition: A | Discharge status: Home / Self Care | Payer: 59 | Source: Ambulatory Visit | Attending: Sports Medicine | Admitting: Sports Medicine

## 2013-05-15 DIAGNOSIS — M79606 Pain in leg, unspecified: Secondary | ICD-10-CM

## 2013-05-15 MED ORDER — IOHEXOL 180 MG/ML  SOLN
1.0000 mL | Freq: Once | INTRAMUSCULAR | Status: AC | PRN
  Administered 2013-05-15: 1 mL via EPIDURAL

## 2013-05-15 MED ORDER — METHYLPREDNISOLONE ACETATE 40 MG/ML INJ SUSP (RADIOLOG
120.0000 mg | Freq: Once | INTRAMUSCULAR | Status: AC
  Administered 2013-05-15: 120 mg via EPIDURAL

## 2013-05-15 NOTE — Discharge Instructions (Signed)

## 2013-05-18 ENCOUNTER — Ambulatory Visit: Payer: 59 | Admitting: Sports Medicine

## 2013-05-26 ENCOUNTER — Encounter: Payer: Self-pay | Admitting: Sports Medicine

## 2013-05-26 DIAGNOSIS — R252 Cramp and spasm: Secondary | ICD-10-CM

## 2013-05-29 ENCOUNTER — Encounter: Payer: Self-pay | Admitting: Sports Medicine

## 2013-05-29 ENCOUNTER — Ambulatory Visit (INDEPENDENT_AMBULATORY_CARE_PROVIDER_SITE_OTHER): Payer: 59 | Admitting: Sports Medicine

## 2013-05-29 VITALS — BP 106/69 | HR 57 | Wt 152.0 lb

## 2013-05-29 DIAGNOSIS — M79606 Pain in leg, unspecified: Secondary | ICD-10-CM

## 2013-05-29 DIAGNOSIS — M79609 Pain in unspecified limb: Secondary | ICD-10-CM

## 2013-05-29 LAB — BASIC METABOLIC PANEL WITH GFR
Potassium: 3.9 meq/L (ref 3.5–5.3)
Sodium: 140 meq/L (ref 135–145)

## 2013-05-29 LAB — BASIC METABOLIC PANEL
BUN: 19 mg/dL (ref 6–23)
CO2: 26 mEq/L (ref 19–32)
Calcium: 9.3 mg/dL (ref 8.4–10.5)
Chloride: 102 mEq/L (ref 96–112)
Creat: 0.75 mg/dL (ref 0.50–1.10)
Glucose, Bld: 56 mg/dL — ABNORMAL LOW (ref 70–99)

## 2013-05-29 LAB — MAGNESIUM: Magnesium: 2.2 mg/dL (ref 1.5–2.5)

## 2013-05-29 NOTE — Assessment & Plan Note (Signed)
Dana Gomez continues to have quadricep symptoms. Initially we suspected mild iron deficiency, her ferritin was low/normal, so we repeated this, she had an initial boost in energy unfortunately symptoms return. A subsequent MRI showed L4-5 disc protrusion with bilateral foraminal stenosis. A diagnostic/therapeutic selective L4 nerve root block was performed in the hopes that this may further improve her quadricep symptoms. This improved her back pain significantly but unfortunately she continues to have a searing-type pain in the quads as her hips come in to flexion. 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 I will see her back in a month, hopefully she will have seen the vascular surgeons by then.

## 2013-05-29 NOTE — Progress Notes (Signed)
  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 Initially we suspected early iron deficiency, she had heavy cycles, her ferritin levels were low-normal and we repleted this with iron sulfate. Symptoms improved only briefly and then returned. She did have a lumbar spine MRI that showed an L4-5 disc protrusion with bilateral foraminal stenosis that appeared to affect both L4 nerve root. We then proceeded with an epidural 2 weeks ago, transforaminal, bilateral, at the L4-L5 level. This improved her back pain significantly but unfortunately she continued to have quadriceps symptoms. They are worse after repeated activity, and flexion of the hips. We had discussed external iliac endofibrosis at our initial visit, we have not yet evaluated her for this.  Past medical history, Surgical history, Family history not pertinant except as noted below, Social history, Allergies, and medications have been entered into the medical record, reviewed, and no changes needed.   Review of Systems: No fevers, chills, night sweats, weight loss, chest pain, or shortness of breath.   Objective:    General: Well Developed, well nourished, and in no acute distress.  Neuro: Alert and oriented x3, extra-ocular muscles intact, sensation grossly intact.  HEENT: Normocephalic, atraumatic, pupils equal round reactive to light, neck supple, no masses, no lymphadenopathy, thyroid nonpalpable.  Skin: Warm and dry, no rashes. Cardiac: Regular rate and rhythm, no murmurs rubs or gallops, no lower extremity edema.  Respiratory: Clear to auscultation bilaterally. Not using accessory muscles, speaking in full sentences. Bilateral Hip: ROM IR: 45 Deg, ER: 45 Deg, Flexion: 120 Deg, Extension: 100 Deg, Abduction: 45 Deg, Adduction: 45  Deg Strength IR: 5/5, ER: 5/5, Flexion: 5/5, Extension: 5/5, Abduction: 5/5, Adduction: 5/5 Pelvic alignment unremarkable to inspection and palpation. Standing hip rotation and gait without trendelenburg sign / unsteadiness. Greater trochanter without tenderness to palpation. No tenderness over piriformis. No pain with FABER or FADIR. No SI joint tenderness and normal minimal SI movement.  Impression and Recommendations:

## 2013-06-01 ENCOUNTER — Encounter: Payer: Self-pay | Admitting: Sports Medicine

## 2013-06-15 ENCOUNTER — Encounter: Payer: Self-pay | Admitting: Sports Medicine

## 2013-06-15 DIAGNOSIS — M5412 Radiculopathy, cervical region: Secondary | ICD-10-CM

## 2013-06-16 MED ORDER — PREDNISONE (PAK) 10 MG PO TABS: ORAL_TABLET | ORAL | Status: DC

## 2013-06-17 ENCOUNTER — Ambulatory Visit (INDEPENDENT_AMBULATORY_CARE_PROVIDER_SITE_OTHER): Payer: 59

## 2013-06-17 DIAGNOSIS — M5412 Radiculopathy, cervical region: Secondary | ICD-10-CM

## 2013-06-18 ENCOUNTER — Encounter: Payer: Self-pay | Admitting: Sports Medicine

## 2013-06-22 ENCOUNTER — Encounter: Payer: Self-pay | Admitting: Sports Medicine

## 2013-06-24 ENCOUNTER — Encounter: Payer: Self-pay | Admitting: Sports Medicine

## 2013-06-26 ENCOUNTER — Ambulatory Visit: Payer: 59 | Admitting: Sports Medicine

## 2013-06-29 ENCOUNTER — Encounter: Payer: Self-pay | Admitting: Vascular Surgery

## 2013-06-30 ENCOUNTER — Encounter: Payer: Self-pay | Admitting: Vascular Surgery

## 2013-06-30 ENCOUNTER — Ambulatory Visit (INDEPENDENT_AMBULATORY_CARE_PROVIDER_SITE_OTHER): Payer: 59 | Admitting: Vascular Surgery

## 2013-06-30 VITALS — BP 116/70 | HR 62 | Ht 65.0 in | Wt 156.0 lb

## 2013-06-30 DIAGNOSIS — M79609 Pain in unspecified limb: Secondary | ICD-10-CM | POA: Insufficient documentation

## 2013-06-30 NOTE — Progress Notes (Signed)
Patient name: Dana Gomez MRN: [id number] DOB: [birth year] Sex: female   Referred by: Thekkekandam  Reason for referral:  Chief Complaint  Patient presents with  . 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 She has a very interesting history. 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 She has had bilateral epidural steroid injections for this has had some improvement. Also reports some pain from improvement with chiropractic manipulation. She has a separate symptoms with pain in her right anterior thigh quadriceps muscle. She reports that this can occurs with a leg squats and can occur after as low as 1 legs quite with weights. She reports soreness following this. She does not associate any of this anterior thigh discomfort with ongoing exercise. Specifically not with running or cycling. She has no history of peripheral vascular occlusive disease and no significant risk factors. She does triathlons and is extremely active.  Past Medical History  Diagnosis Date  . 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 Schleral buckle  [id number],2005  . Nasal sinus surgery  1999    History   Social History  . Marital Status: Single    Spouse Name: N/A    Number of Children: N/A  . Years of Education: N/A   Occupational History  . Not on file.   Social History Main Topics  . Smoking status: Never Smoker   . Smokeless tobacco: Not on file  . Alcohol Use: 1.2 oz/week    2 Glasses of wine per week  . Drug Use: No  . Sexual Activity: Yes   Other Topics Concern  . Not on file   Social History Narrative  . No narrative on file    Family History  Problem Relation Age of Onset  . Breast cancer Maternal Grandmother   . Hyperlipidemia Mother   . Alcoholism Mother   . Hypertension  Mother   . Alcoholism Father     Allergies as of 06/30/2013 - Review Complete 06/30/2013  Allergen Reaction Noted  . Effexor [venlafaxine] Other (See Comments) 08/05/2012  . Azithromycin Rash 09/01/2012  . Erythromycin Rash 08/05/2012    Current Outpatient Prescriptions on File Prior to Visit  Medication Sig Dispense Refill  . ferrous sulfate 325 (65 FE) MG EC tablet Take 325 mg by mouth daily.      . fluticasone (FLONASE) 50 MCG/ACT nasal spray Two sprays each nostril daily.  16 g  2  . gabapentin (NEURONTIN) 300 MG capsule One tab PO qHS for a week, then BID for a week, then TID. May double weekly to a max of 3,600mg /day  180 capsule  3  . predniSONE (STERAPRED UNI-PAK) 10 MG tablet 12 day taper pack, use as directed  1 tablet  0   No current facility-administered medications on file prior to visit.     REVIEW OF SYSTEMS:  Positives indicated with an "X"  CARDIOVASCULAR:  [ ]  chest pain   [ ]  chest pressure   [ ]  palpitations   [ ]  orthopnea   [ ]  dyspnea on exertion   [ ]  claudication   [ ]  rest pain   [ ]  DVT   [ ]  phlebitis PULMONARY:   [ ]  productive cough   [ ]  asthma   [ ]  wheezing  NEUROLOGIC:   [ x] weakness  [ ]  paresthesias  [ ]  aphasia  [ ]  amaurosis  [ ]  dizziness HEMATOLOGIC:   [ ]  bleeding problems   [ ]  clotting disorders MUSCULOSKELETAL:  [ ]  joint pain   [ ]  joint swelling GASTROINTESTINAL: [ ]   blood in stool  [ ]   hematemesis GENITOURINARY:  [ ]   dysuria  [ ]   hematuria PSYCHIATRIC:  [ ]  history of major depression INTEGUMENTARY:  [ ]  rashes  [ ]  ulcers CONSTITUTIONAL:  [ ]  fever   [ ]  chills  PHYSICAL EXAMINATION:  General: The patient is a well-nourished female, in no acute distress. Vital signs are BP 116/70  Pulse 62  Ht 5\' 5"  (1.651 m)  Wt 156 lb (70.761 kg)  BMI 25.96 kg/m2  SpO2 100%  LMP 06/03/2013 Pulmonary: There is a good air exchange  Abdomen: Soft and non-tender with normal pitch bowel sounds. No abdominal or lower quadrant  bruits Musculoskeletal: There are no major deformities.  There is no significant extremity pain. Neurologic: No focal weakness or paresthesias are detected, Skin: There are no ulcer or rashes noted. Psychiatric: The patient has normal affect. 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 Extends to be quite uncommon cause for atherosclerotic disease. This is been reported in competitive cyclist with a radical damage to the artery with repeated repetitive motion in the pelvis. These patients have exercise-induced ischemia with a classic claudication symptoms brought on with a great deal of exercise. The patient specifically denies that there is any relationship her anterior thigh pain associated with exercise. This does appear to be more positional with leg squats. I extended she does have completely normal resting exam and I do not feel with her history not associated with exercise that we would recommend any further evaluation. She was reassured this discussion will see us on an as-needed basis    EARLY, TODD [facility] Office: [phone]

## 2013-07-02 ENCOUNTER — Encounter: Payer: Self-pay | Admitting: Sports Medicine

## 2013-07-02 ENCOUNTER — Ambulatory Visit (INDEPENDENT_AMBULATORY_CARE_PROVIDER_SITE_OTHER): Payer: 59 | Admitting: Sports Medicine

## 2013-07-02 VITALS — BP 116/73 | HR 62 | Ht 65.0 in | Wt 156.0 lb

## 2013-07-02 DIAGNOSIS — R5381 Other malaise: Secondary | ICD-10-CM

## 2013-07-02 DIAGNOSIS — R5383 Other fatigue: Secondary | ICD-10-CM

## 2013-07-02 DIAGNOSIS — M79606 Pain in leg, unspecified: Secondary | ICD-10-CM

## 2013-07-02 DIAGNOSIS — M79609 Pain in unspecified limb: Secondary | ICD-10-CM

## 2013-07-02 MED ORDER — CREATINE MONOHYDRATE POWD: Status: DC

## 2013-07-02 MED ORDER — GABAPENTIN 600 MG PO TABS: ORAL_TABLET | ORAL | Status: DC

## 2013-07-02 MED ORDER — AMBULATORY NON FORMULARY MEDICATION: Status: DC

## 2013-07-02 NOTE — Assessment & Plan Note (Signed)
She does endorse occasional episodes of shakiness, fatigue, and extreme hunger. She wonders if these may be related to episodic hypoglycemia. I am going to write a prescription for a blood sugar meter, I like her to keep a diary and check her blood sugars when these symptoms arise. If she does have low blood sugars this would certainly trigger evaluation for certain insulin secreting pancreatic tumors.  She can follow this up with her primary care provider

## 2013-07-02 NOTE — Assessment & Plan Note (Signed)
At this point Dana Gomez's symptoms are greatly improved. She does have an L4-L5 disc protrusion, we did proceed with a diagnostic/therapeutic L4-L5 transforaminal epidural but improved for quadriceps symptoms significantly. This combined with gabapentin have put her in a good place. I'm going to switch this to 600 mg up to 3 times a day. She did see vascular surgery, they did not think the external iliac endofibrosis was a possibility, Especially considering her response to approach of her symptoms from a musculoskeletal perspective. I am going to add creatine. She can come back to see me as needed.  I do think her prognosis is good but she will likely need to be on a medicine of some kind daily, and have occasional epidurals.

## 2013-07-02 NOTE — Progress Notes (Signed)
  Subjective:    CC: Followup  HPI:  Bilateral thigh pain: We are the operating with a presumptive diagnosis of L4 radiculopathy, bilateral, she had a good response to her L4 selective nerve root block, and gabapentin, and her pain is all but gone. She did see Dr. Arbie CookeyEarly with vascular surgery as I did have a slight suspicion for external iliac and/or fibrosis. He did not think that this was at play, and has released her from his care. Overall she's doing well, she does get some searing pain in her quads. Overall very happy with the results.  Question hypoglycemia: Previous fasting blood work showed blood sugars in the 60s, she gets episodes where she becomes shaky, weak, wonders if she is becoming hypoglycemic. She does not have a home glucometer.  Past medical history, Surgical history, Family history not pertinant except as noted below, Social history, Allergies, and medications have been entered into the medical record, reviewed, and no changes needed.   Review of Systems: No fevers, chills, night sweats, weight loss, chest pain, or shortness of breath.   Objective:    General: Well Developed, well nourished, and in no acute distress.  Neuro: Alert and oriented x3, extra-ocular muscles intact, sensation grossly intact.  HEENT: Normocephalic, atraumatic, pupils equal round reactive to light, neck supple, no masses, no lymphadenopathy, thyroid nonpalpable.  Skin: Warm and dry, no rashes. Cardiac: Regular rate and rhythm, no murmurs rubs or gallops, no lower extremity edema.  Respiratory: Clear to auscultation bilaterally. Not using accessory muscles, speaking in full sentences.  Impression and Recommendations:

## 2013-07-06 ENCOUNTER — Encounter: Payer: Self-pay | Admitting: Sports Medicine

## 2013-07-09 ENCOUNTER — Encounter: Payer: Self-pay | Admitting: Family Medicine

## 2013-07-09 ENCOUNTER — Ambulatory Visit (INDEPENDENT_AMBULATORY_CARE_PROVIDER_SITE_OTHER): Payer: 59 | Admitting: Family Medicine

## 2013-07-09 VITALS — BP 118/70 | HR 59 | Temp 98.1°F | Wt 155.0 lb

## 2013-07-09 DIAGNOSIS — A499 Bacterial infection, unspecified: Secondary | ICD-10-CM

## 2013-07-09 DIAGNOSIS — J309 Allergic rhinitis, unspecified: Secondary | ICD-10-CM

## 2013-07-09 DIAGNOSIS — B9689 Other specified bacterial agents as the cause of diseases classified elsewhere: Secondary | ICD-10-CM

## 2013-07-09 DIAGNOSIS — Z13220 Encounter for screening for lipoid disorders: Secondary | ICD-10-CM

## 2013-07-09 DIAGNOSIS — J329 Chronic sinusitis, unspecified: Secondary | ICD-10-CM

## 2013-07-09 MED ORDER — LEVOFLOXACIN 500 MG PO TABS: 500.0000 mg | ORAL_TABLET | Freq: Every day | ORAL | Status: DC

## 2013-07-09 MED ORDER — FLUTICASONE PROPIONATE 50 MCG/ACT NA SUSP: NASAL | Status: DC

## 2013-07-09 NOTE — Progress Notes (Signed)
CC: Cora DanielsRose Salzwedel is a 38 y.o. female is here for Sinusitis   Subjective: HPI:  Complains of facial pressure in the forehead with nasal congestion and subjective postnasal drip that has been present for the past 3 days, actually originally began more than a week ago and was slowly improving on a daily basis until it rebounded 3 days ago. Now moderate in severity. Has tried Alka-Seltzer cold and sinus without much benefit no other interventions. Accompanied by ear fullness without hearing loss or motor sensory disturbances. Denies fevers, chills, cough, shortness of breath. Symptoms originally began when she ran out of her nasal steroid   Review Of Systems Outlined In HPI  Past Medical History  Diagnosis Date  . 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 Schleral buckle  [id number],2005  . Nasal sinus surgery  1999   Family History  Problem Relation Age of Onset  . Breast cancer Maternal Grandmother   . Hyperlipidemia Mother   . Alcoholism Mother   . Hypertension Mother   . Alcoholism Father     History   Social History  . Marital Status: Single    Spouse Name: N/A    Number of Children: N/A  . Years of Education: N/A   Occupational History  . Not on file.   Social History Main Topics  . Smoking status: Never Smoker   . Smokeless tobacco: Not on file  . Alcohol Use: 1.2 oz/week    2 Glasses of wine per week  . Drug Use: No  . Sexual Activity: Yes   Other Topics Concern  . Not on file   Social History Narrative  . No narrative on file     Objective: BP 118/70  Pulse 59  Temp(Src) 98.1 F (36.7 C) (Oral)  Wt 155 lb (70.308 kg)  LMP 06/03/2013  General: Alert and Oriented, No Acute Distress HEENT: Pupils equal, round, reactive to light. Conjunctivae clear.  External ears unremarkable, canals clear with intact TMs with appropriate landmarks.  Middle ear appears open without effusion. Pink inferior turbinates.  Moist  mucous membranes, pharynx without inflammation nor lesions. However moderate cobblestoning.  Neck supple without palpable lymphadenopathy nor abnormal masses. Lungs: Clear to auscultation bilaterally, no wheezing/ronchi/rales.  Comfortable work of breathing. Good air movement. Skin: Warm and dry.  Assessment & Plan: [name] was seen today for sinusitis.  Diagnoses and associated orders for this visit:  Allergic sinusitis - fluticasone (FLONASE) 50 MCG/ACT nasal spray; Two sprays each nostril daily.  Bacterial sinusitis - levofloxacin (LEVAQUIN) 500 MG tablet; Take 1 tablet (500 mg total) by mouth daily.  Lipid screening - Lipid panel    Bacterial sinusitis: Start Levaquin consider nasal saline washes. Chronic Allergic sinusitis: Restart daily Flonase She's due for an annual dyslipidemia screening  Return if symptoms worsen or fail to improve.

## 2013-07-13 ENCOUNTER — Encounter: Payer: Self-pay | Admitting: Sports Medicine

## 2013-07-16 LAB — LIPID PANEL
Cholesterol: 192 mg/dL (ref 0–200)
HDL: 82 mg/dL (ref >39)
LDL Cholesterol: 101 mg/dL — ABNORMAL HIGH (ref 0–99)
Total CHOL/HDL Ratio: 2.3 Ratio
Triglycerides: 45 mg/dL (ref <150)
VLDL: 9 mg/dL (ref 0–40)

## 2013-07-20 ENCOUNTER — Encounter: Payer: Self-pay | Admitting: Sports Medicine

## 2013-07-25 ENCOUNTER — Encounter: Payer: Self-pay | Admitting: Sports Medicine

## 2013-09-08 ENCOUNTER — Encounter: Payer: Self-pay | Admitting: Family Medicine

## 2013-09-08 DIAGNOSIS — J329 Chronic sinusitis, unspecified: Principal | ICD-10-CM

## 2013-09-08 DIAGNOSIS — B9689 Other specified bacterial agents as the cause of diseases classified elsewhere: Secondary | ICD-10-CM

## 2013-09-08 MED ORDER — CEFDINIR 300 MG PO CAPS: 300.0000 mg | ORAL_CAPSULE | Freq: Two times a day (BID) | ORAL | Status: AC

## 2013-09-29 ENCOUNTER — Encounter: Payer: Self-pay | Admitting: Family Medicine

## 2013-09-29 DIAGNOSIS — H6980 Other specified disorders of Eustachian tube, unspecified ear: Secondary | ICD-10-CM

## 2013-10-12 DIAGNOSIS — Z0279 Encounter for issue of other medical certificate: Secondary | ICD-10-CM

## 2013-10-27 ENCOUNTER — Ambulatory Visit: Payer: 59 | Admitting: Sports Medicine

## 2013-10-27 ENCOUNTER — Encounter: Payer: Self-pay | Admitting: Sports Medicine

## 2013-10-27 MED ORDER — ACETAMINOPHEN-CODEINE #3 300-30 MG PO TABS: 1.0000 | ORAL_TABLET | Freq: Three times a day (TID) | ORAL | Status: DC | PRN

## 2013-10-27 NOTE — Telephone Encounter (Signed)
Per patient, please fax to [facility] [address] in [place]. Rx for tylenol #3 in box.

## 2013-11-02 ENCOUNTER — Encounter: Payer: Self-pay | Admitting: Family Medicine

## 2013-11-02 DIAGNOSIS — J329 Chronic sinusitis, unspecified: Principal | ICD-10-CM

## 2013-11-02 DIAGNOSIS — B9689 Other specified bacterial agents as the cause of diseases classified elsewhere: Secondary | ICD-10-CM

## 2013-11-03 MED ORDER — LEVOFLOXACIN 500 MG PO TABS: 500.0000 mg | ORAL_TABLET | Freq: Every day | ORAL | Status: DC

## 2013-11-24 ENCOUNTER — Encounter: Payer: Self-pay | Admitting: Sports Medicine

## 2013-11-24 DIAGNOSIS — M5416 Radiculopathy, lumbar region: Secondary | ICD-10-CM

## 2013-11-27 NOTE — Telephone Encounter (Signed)
Ordered bilateral epidurals, please contact  imaging for scheduling.

## 2013-12-01 NOTE — Telephone Encounter (Signed)
Dana Gomez please look into this, she still hasnt gotten a call re: her [facility]ural [facility] [employer]([facility]).  Her insurance changes in a week so this needs to be done beforehand!

## 2013-12-08 ENCOUNTER — Ambulatory Visit
Admission: RE | Admit: 2013-12-08 | Discharge: 2013-12-08 | Disposition: A | Discharge status: Home / Self Care | Payer: 59 | Source: Ambulatory Visit | Attending: Sports Medicine | Admitting: Sports Medicine

## 2013-12-08 ENCOUNTER — Other Ambulatory Visit: Payer: Self-pay | Admitting: Sports Medicine

## 2013-12-08 DIAGNOSIS — R5383 Other fatigue: Secondary | ICD-10-CM

## 2013-12-08 DIAGNOSIS — M5416 Radiculopathy, lumbar region: Secondary | ICD-10-CM

## 2013-12-08 MED ORDER — IOHEXOL 180 MG/ML  SOLN: 1.0000 mL | Freq: Once | INTRAMUSCULAR | Status: AC | PRN

## 2013-12-08 MED ORDER — DIAZEPAM 5 MG PO TABS
5.0000 mg | ORAL_TABLET | Freq: Once | ORAL | Status: AC
  Administered 2013-12-08: 5 mg via ORAL

## 2013-12-08 MED ORDER — METHYLPREDNISOLONE ACETATE 40 MG/ML INJ SUSP (RADIOLOG: 120.0000 mg | Freq: Once | INTRAMUSCULAR | Status: DC

## 2013-12-08 NOTE — Discharge Instructions (Signed)

## 2014-01-04 ENCOUNTER — Encounter: Payer: Self-pay | Admitting: Sports Medicine

## 2014-01-04 DIAGNOSIS — M5416 Radiculopathy, lumbar region: Secondary | ICD-10-CM

## 2014-01-12 ENCOUNTER — Encounter: Payer: Self-pay | Admitting: Sports Medicine

## 2014-01-12 ENCOUNTER — Telehealth: Payer: Self-pay | Admitting: Radiology

## 2014-01-12 NOTE — Telephone Encounter (Signed)
Pt called c/o injection site pain from an injection done on 12/08/13. There is no swelling, no redness, no heat from the area. Asked her to follow up with Dr. Karie Schwalbe. So he could do a visual inspection of the site.

## 2014-01-19 DIAGNOSIS — Z0379 Encounter for other suspected maternal and fetal conditions ruled out: Secondary | ICD-10-CM

## 2014-02-03 ENCOUNTER — Encounter: Payer: Self-pay | Admitting: Physical Therapy

## 2014-03-01 ENCOUNTER — Encounter: Payer: Self-pay | Admitting: Family Medicine

## 2014-03-10 ENCOUNTER — Encounter: Payer: Self-pay | Admitting: Family Medicine

## 2014-03-10 ENCOUNTER — Ambulatory Visit (INDEPENDENT_AMBULATORY_CARE_PROVIDER_SITE_OTHER): Payer: Managed Care, Other (non HMO) | Admitting: Family Medicine

## 2014-03-10 VITALS — BP 120/70 | HR 57 | Ht 65.5 in | Wt 152.0 lb

## 2014-03-10 DIAGNOSIS — R1013 Epigastric pain: Secondary | ICD-10-CM | POA: Insufficient documentation

## 2014-03-10 DIAGNOSIS — Z0000 Encounter for general adult medical examination without abnormal findings: Secondary | ICD-10-CM

## 2014-03-10 MED ORDER — GI COCKTAIL ~~LOC~~
60.0000 mL | Freq: Once | ORAL | Status: AC
  Administered 2014-03-10: 60 mL via ORAL

## 2014-03-10 NOTE — Progress Notes (Signed)
CC: Dana Gomez is a 39 y.o. female is here for Annual Exam   Subjective: HPI:  Colonoscopy:No family history Papsmear: Normal 2014 repeat 2017 Mammogram: Start at 42   Influenza Vaccine:  Pneumovax: No current indication Td/Tdap: UTD as of 2008 Zoster: (Start 39 yo)  "Bullet Proof Coffee" diet  Requesting complete physical exam with her only complaint being epigastric discomfort. She has a history of gastric reflux but this feels different than prior episodes of reflux. It's localized in the epigastric region feels like a fullness and mild burning sensation. Worse with any volume added to the stomach regardless of liquid or solid. Seems to be absent in the morning. The only change in her life recently has been increasing her coffee consumption and fasting during the morning. Denies any radiation of the pain, vomiting, regurgitation, nor new back pain  Review of Systems - General ROS: negative for - chills, fever, night sweats, weight gain or weight loss Ophthalmic ROS: negative for - decreased vision Psychological ROS: negative for - anxiety or depression ENT ROS: negative for - hearing change, nasal congestion, tinnitus or allergies Hematological and Lymphatic ROS: negative for - bleeding problems, bruising or swollen lymph nodes Breast ROS: negative Respiratory ROS: no cough, shortness of breath, or wheezing Cardiovascular ROS: no chest pain or dyspnea on exertion Gastrointestinal ROS: nochange in bowel habits, or black or bloody stools Genito-Urinary ROS: negative for - genital discharge, genital ulcers, incontinence or abnormal bleeding from genitals Musculoskeletal ROS: negative for - joint pain or muscle pain Neurological ROS: negative for - headaches or memory loss Dermatological ROS: negative for lumps, mole changes, rash and skin lesion changes  Past Medical History  Diagnosis Date  . 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 Schleral buckle  [id number]  . Nasal sinus surgery  1999   Family History  Problem Relation Age of Onset  . Breast cancer Maternal Grandmother   . Hyperlipidemia Mother   . Alcoholism Mother   . Hypertension Mother   . Alcoholism Father     History   Social History  . Marital Status: Married    Spouse Name: N/A  . Number of Children: N/A  . Years of Education: N/A   Occupational History  . Not on file.   Social History Main Topics  . Smoking status: Never Smoker   . Smokeless tobacco: Not on file  . Alcohol Use: 1.2 oz/week    2 Glasses of wine per week  . Drug Use: No  . Sexual Activity: Yes   Other Topics Concern  . Not on file   Social History Narrative     Objective: BP 120/70 mmHg  Pulse 57  Ht 5' 5.5" (1.664 m)  Wt 152 lb (68.947 kg)  BMI 24.90 kg/m2  General: No Acute Distress HEENT: Atraumatic, normocephalic, conjunctivae normal without scleral icterus.  No nasal discharge, hearing grossly intact, TMs with good landmarks bilaterally with no middle ear abnormalities, posterior pharynx clear without oral lesions. Neck: Supple, trachea midline, no cervical nor supraclavicular adenopathy. Pulmonary: Clear to auscultation bilaterally without wheezing, rhonchi, nor rales. Cardiac: Regular rate and rhythm.  No murmurs, rubs, nor gallops. No peripheral edema.  2+ peripheral pulses bilaterally. Abdomen: Bowel sounds normal.  No masses.  Non-tender without rebound.  Negative Murphy's sign. GU: Declined MSK: Grossly intact, no signs of weakness.  Full strength throughout upper and lower extremities.  Full ROM in upper and lower extremities.  No midline spinal tenderness. Neuro: Gait unremarkable, CN II-XII grossly intact.  C5-C6 Reflex 2/4 Bilaterally, L4 Reflex 2/4 Bilaterally.  Cerebellar function intact. Skin: No rashes. Psych: Alert and oriented to person/place/time.  Thought process normal. No anxiety/depression.   Assessment & Plan: Dana Gomez  was seen today for annual exam.  Diagnoses and all orders for this visit:  Abdominal pain, epigastric Orders: -     gi cocktail (Maalox,Lidocaine,Donnatal); Take 60 mLs by mouth once.  Annual physical exam   Healthy lifestyle interventions including but not limited to regular exercise, a healthy low fat diet, moderation of salt intake, the dangers of tobacco/alcohol/recreational drug use, nutrition supplementation, and accident avoidance were discussed with the patient and a handout was provided for future reference.  She was given 2 doses of a GI cocktail to see if this helps alleviate her pain. Since I assume that this has carbohydrates in her cocktail and she needs to be fasting for her strict diet she'll take this later in the afternoon and call me if it's helpful or not helpful to use as a diagnostic tool on whether or not the pain is coming from her stomach or esophagus  Return if symptoms worsen or fail to improve.

## 2014-07-07 ENCOUNTER — Encounter: Payer: Self-pay | Admitting: Family Medicine

## 2014-07-07 ENCOUNTER — Ambulatory Visit (INDEPENDENT_AMBULATORY_CARE_PROVIDER_SITE_OTHER): Payer: Managed Care, Other (non HMO) | Admitting: Family Medicine

## 2014-07-07 VITALS — BP 123/78 | HR 51 | Temp 98.3°F | Wt 148.0 lb

## 2014-07-07 DIAGNOSIS — R5383 Other fatigue: Secondary | ICD-10-CM

## 2014-07-07 DIAGNOSIS — J029 Acute pharyngitis, unspecified: Secondary | ICD-10-CM

## 2014-07-07 LAB — POCT RAPID STREP A (OFFICE): RAPID STREP A SCREEN: NEGATIVE

## 2014-07-07 MED ORDER — PREDNISONE 20 MG PO TABS: ORAL_TABLET | ORAL | Status: AC

## 2014-07-07 NOTE — Progress Notes (Signed)
CC: Dana Gomez is a 39 y.o. female is here for Sore Throat and URI?   Subjective: HPI:  Complains of sore throat and nonproductive cough and fatigue has been present for the past 2 weeks. Symptoms are moderate in severity. Throat pain is worse with coughing. Mild benefit from ibuprofen. Fatigue is to a degree where she does not want to train for an upcoming triathlon in September. Symptoms began the day after a half iron man and have been persistent ever since. Not willing to exercise is extremely out of character for her. She reports reddening of the eyes on review of system questioning. She denies fevers, chills, nasal congestion, postnasal drip, shortness of breath, chest pain, nor myalgias. No rashes.review of systems positive for cough and wheezing however temporarily responsive to albuterol   Review Of Systems Outlined In HPI  Past Medical History  Diagnosis Date  . 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 Schleral buckle  [id number]  . Nasal sinus surgery  1999   Family History  Problem Relation Age of Onset  . Breast cancer Maternal Grandmother   . Hyperlipidemia Mother   . Alcoholism Mother   . Hypertension Mother   . Alcoholism Father     History   Social History  . Marital Status: Married    Spouse Name: N/A  . Number of Children: N/A  . Years of Education: N/A   Occupational History  . Not on file.   Social History Main Topics  . Smoking status: Never Smoker   . Smokeless tobacco: Not on file  . Alcohol Use: 1.2 oz/week    2 Glasses of wine per week  . Drug Use: No  . Sexual Activity: Yes   Other Topics Concern  . Not on file   Social History Narrative     Objective: BP 123/78 mmHg  Pulse 51  Temp(Src) 98.3 F (36.8 C) (Oral)  Wt 148 lb (67.132 kg)  SpO2 100%  General: Alert and Oriented, No Acute Distress HEENT: Pupils equal, round, reactive to light. Conjunctivae clear.  External ears  unremarkable, canals clear with intact TMs with appropriate landmarks.  Middle ear appears open without effusion. Pink inferior turbinates.  Moist mucous membranes, pharynx without inflammation nor lesions.  Neck supple without palpable lymphadenopathy nor abnormal masses. Lungs: Clear to auscultation bilaterally, no wheezing/ronchi/rales.  Comfortable work of breathing. Good air movement. Cardiac: Regular rate and rhythm. Normal S1/S2.  No murmurs, rubs, nor gallops.   Extremities: No peripheral edema.  Strong peripheral pulses.  Mental Status: No depression, anxiety, nor agitation. Skin: Warm and dry.  Assessment & Plan: Dana Gomez was seen today for sore throat and uri?.  Diagnoses and all orders for this visit:  Sore throat Orders: -     predniSONE (DELTASONE) 20 MG tablet; Three tabs daily days 1-3, two tabs daily days 4-6, one tab daily days 7-9, half tab daily days 10-13. -     POCT rapid strep A  Other fatigue Orders: -     Epstein-Barr virus VCA, IgG -     Epstein-Barr virus VCA, IgM   Strep test is negative and clinical suspicion for this is low. Moderate suspicion for mononucleosis which she has had before therefore checking both IgG and IgM. Ultimate plan will be based on these results. Prednisone for alleviation of wheezing and cough.  Return if symptoms worsen or fail to improve.  25 minutes spent face-to-face during visit today of  which at least 50% was counseling or coordinating care regarding: 1. Sore throat   2. Other fatigue

## 2014-07-08 ENCOUNTER — Encounter: Payer: Self-pay | Admitting: Family Medicine

## 2014-07-08 DIAGNOSIS — R5382 Chronic fatigue, unspecified: Secondary | ICD-10-CM

## 2014-07-08 LAB — EPSTEIN-BARR VIRUS VCA, IGM: EBV VCA IgM: 24 U/mL (ref <36.0)

## 2014-07-08 LAB — EPSTEIN-BARR VIRUS VCA, IGG: EBV VCA IgG: 129 U/mL — ABNORMAL HIGH

## 2014-07-09 MED ORDER — BENZONATATE 200 MG PO CAPS: 200.0000 mg | ORAL_CAPSULE | Freq: Two times a day (BID) | ORAL | Status: DC | PRN

## 2014-07-12 ENCOUNTER — Telehealth: Payer: Self-pay | Admitting: Family Medicine

## 2014-07-12 DIAGNOSIS — R5382 Chronic fatigue, unspecified: Secondary | ICD-10-CM

## 2014-07-12 NOTE — Telephone Encounter (Signed)
Dana Gomez, Lab slip in your inbox if needed.

## 2014-07-12 NOTE — Addendum Note (Signed)
Addended by: Laren Boom on: 07/12/2014 01:17 PM   Modules accepted: Orders

## 2014-07-12 NOTE — Telephone Encounter (Signed)
Patient walked-in request to have cbc added to her lab order. Lab tech drew extra blood already. Thanks

## 2014-07-13 LAB — HIV ANTIBODY (ROUTINE TESTING W REFLEX): HIV 1&2 Ab, 4th Generation: NONREACTIVE

## 2014-07-13 LAB — CYTOMEGALOVIRUS ANTIBODY, IGG: Cytomegalovirus Ab-IgG: >10 U/mL — ABNORMAL HIGH (ref <0.60)

## 2014-07-13 LAB — CMV IGM

## 2014-07-13 NOTE — Telephone Encounter (Signed)
Labs faxed

## 2014-07-16 ENCOUNTER — Encounter: Payer: Self-pay | Admitting: Family Medicine

## 2014-07-16 MED ORDER — CEFDINIR 300 MG PO CAPS: 300.0000 mg | ORAL_CAPSULE | Freq: Two times a day (BID) | ORAL | Status: AC

## 2014-07-16 MED ORDER — PREDNISONE 20 MG PO TABS: ORAL_TABLET | ORAL | Status: AC

## 2014-07-20 ENCOUNTER — Encounter: Payer: Self-pay | Admitting: Family Medicine

## 2014-08-02 ENCOUNTER — Encounter: Payer: Self-pay | Admitting: Sports Medicine

## 2014-08-02 DIAGNOSIS — R05 Cough: Secondary | ICD-10-CM

## 2014-08-02 DIAGNOSIS — J45990 Exercise induced bronchospasm: Secondary | ICD-10-CM

## 2014-08-02 DIAGNOSIS — R058 Other specified cough: Secondary | ICD-10-CM | POA: Insufficient documentation

## 2014-08-16 ENCOUNTER — Ambulatory Visit: Payer: Managed Care, Other (non HMO) | Admitting: Sports Medicine

## 2014-08-16 ENCOUNTER — Encounter: Payer: Self-pay | Admitting: Sports Medicine

## 2014-08-24 ENCOUNTER — Encounter: Payer: Self-pay | Admitting: Family Medicine

## 2014-08-24 ENCOUNTER — Ambulatory Visit (INDEPENDENT_AMBULATORY_CARE_PROVIDER_SITE_OTHER): Payer: Managed Care, Other (non HMO) | Admitting: Family Medicine

## 2014-08-24 VITALS — BP 117/80 | HR 50 | Wt 148.0 lb

## 2014-08-24 DIAGNOSIS — G4700 Insomnia, unspecified: Secondary | ICD-10-CM

## 2014-08-24 DIAGNOSIS — R1310 Dysphagia, unspecified: Secondary | ICD-10-CM | POA: Diagnosis not present

## 2014-08-24 DIAGNOSIS — R491 Aphonia: Secondary | ICD-10-CM | POA: Diagnosis not present

## 2014-08-24 DIAGNOSIS — R5383 Other fatigue: Secondary | ICD-10-CM

## 2014-08-24 LAB — COMPLETE METABOLIC PANEL WITH GFR
ALBUMIN: 4.5 g/dL (ref 3.6–5.1)
ALT: 23 U/L (ref 6–29)
AST: 17 U/L (ref 10–30)
Alkaline Phosphatase: 49 U/L (ref 33–115)
BUN: 17 mg/dL (ref 7–25)
CO2: 25 mmol/L (ref 20–31)
Calcium: 9.3 mg/dL (ref 8.6–10.2)
Chloride: 102 mmol/L (ref 98–110)
Creat: 0.82 mg/dL (ref 0.50–1.10)
GFR, Est Non African American: >89 mL/min (ref >=60)
GLUCOSE: 90 mg/dL (ref 65–99)
POTASSIUM: 4.3 mmol/L (ref 3.5–5.3)
Sodium: 137 mmol/L (ref 135–146)
TOTAL PROTEIN: 6.9 g/dL (ref 6.1–8.1)
Total Bilirubin: 1.1 mg/dL (ref 0.2–1.2)

## 2014-08-24 LAB — CBC
HCT: 44.2 % (ref 36.0–46.0)
HEMOGLOBIN: 14.8 g/dL (ref 12.0–15.0)
MCH: 29.1 pg (ref 26.0–34.0)
MCHC: 33.5 g/dL (ref 30.0–36.0)
MCV: 87 fL (ref 78.0–100.0)
MPV: 9.8 fL (ref 8.6–12.4)
Platelets: 276 10*3/uL (ref 150–400)
RBC: 5.08 MIL/uL (ref 3.87–5.11)
RDW: 13.4 % (ref 11.5–15.5)
WBC: 4.8 10*3/uL (ref 4.0–10.5)

## 2014-08-24 LAB — IRON AND TIBC
%SAT: 27 % (ref 20–55)
Iron: 93 ug/dL (ref 42–145)
TIBC: 341 ug/dL (ref 250–470)
UIBC: 248 ug/dL (ref 125–400)

## 2014-08-24 LAB — VITAMIN B12: VITAMIN B 12: 643 pg/mL (ref 211–911)

## 2014-08-24 LAB — T4, FREE: FREE T4: 1 ng/dL (ref 0.80–1.80)

## 2014-08-24 LAB — TSH: TSH: 0.758 u[IU]/mL (ref 0.350–4.500)

## 2014-08-24 NOTE — Progress Notes (Signed)
CC: Dana Gomez is a 39 y.o. female is here for throat feels constricted and Insomnia   Subjective: HPI:   for the last 2 months patient has been  Experiencing fatigue and fluctuation in her ability to talk  On a daily basis, symptoms were trace in severity while taking prednisone however without prednisone are moderate in severity.  Some days she'll feel well rested only to be followed by 2 days of not being able to fall asleep or stay asleep. Nothing seems to make symptoms better or worse other than above. Her loss of voice is described further as a raspiness  Which is  Worse first thing in the morning and late in the day when she is fatigued.  She also has a sensation that there is swelling in the back of her throat but denies any visual or tactile changes on the neck. The swelling gives her sensation that things are getting stuck in the back of her throat.  She's also been experiencing heat intolerance and will be sweating even when in cold air-conditioned environments.   She denies any element of shortness of breath or weakness. She denies any other motor or sensory disturbance not described above.  She denies unintentional weight loss or gain. She's lost  Interest in former hobbies such as exercising.   Review Of Systems Outlined In HPI  Past Medical History  Diagnosis Date  . 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 Schleral buckle  [id number]  . Nasal sinus surgery  1999   Family History  Problem Relation Age of Onset  . Breast cancer Maternal Grandmother   . Hyperlipidemia Mother   . Alcoholism Mother   . Hypertension Mother   . Alcoholism Father     History   Social History  . Marital Status: Married    Spouse Name: N/A  . Number of Children: N/A  . Years of Education: N/A   Occupational History  . Not on file.   Social History Main Topics  . Smoking status: Never Smoker   . Smokeless tobacco: Not on file  .  Alcohol Use: 1.2 oz/week    2 Glasses of wine per week  . Drug Use: No  . Sexual Activity: Yes   Other Topics Concern  . Not on file   Social History Narrative     Objective: BP 117/80 mmHg  Pulse 50  Wt 148 lb (67.132 kg)  General: Alert and Oriented, No Acute Distress HEENT: Pupils equal, round, reactive to light. Conjunctivae clear.  External ears unremarkablePink inferior turbinates.  Moist mucous membranes, pharynx without inflammation nor lesions.  Neck supple without palpable lymphadenopathy nor abnormal masses. Lungs: Clear to auscultation bilaterally, no wheezing/ronchi/rales.  Comfortable work of breathing. Good air movement. Cardiac: Regular rate and rhythm. Normal S1/S2.  No murmurs, rubs, nor gallops.   Extremities: No peripheral edema.  Strong peripheral pulses.  Mental Status: No depression, anxiety, nor agitation. Skin: Warm and dry.  Assessment & Plan: Fatuma was seen today for throat feels constricted and insomnia.  Diagnoses and all orders for this visit:  Other fatigue Orders: -     Vit D  25 hydroxy (rtn osteoporosis monitoring) -     Cancel: Vitamin B12 -     Cancel: CBC With Differential -     COMPLETE METABOLIC PANEL WITH GFR -     TSH -     T4, free -     CBC -  Iron and TIBC -     Vitamin B12  Dysphagia Orders: -     Vit D  25 hydroxy (rtn osteoporosis monitoring) -     Cancel: Vitamin B12 -     Cancel: CBC With Differential -     COMPLETE METABOLIC PANEL WITH GFR -     TSH -     T4, free -     CBC -     Iron and TIBC  Insomnia Orders: -     Vit D  25 hydroxy (rtn osteoporosis monitoring) -     Cancel: Vitamin B12 -     Cancel: CBC With Differential -     COMPLETE METABOLIC PANEL WITH GFR -     TSH -     T4, free -     CBC -     Iron and TIBC  Loss of voice Orders: -     CBC -     Iron and TIBC    differential remains broad for the collection of her symptoms right now,  Screening labs  Mentioned above  If all blood work  is normal will refer to ear nose and throat for direct visualization of her local cords, I let her know that I do think be wise  To go through with  Spirometry scheduled through Dr. Karie Schwalbe  25 minutes spent face-to-face during visit today of which at least 50% was counseling or coordinating care regarding: 1. Other fatigue   2. Dysphagia   3. Insomnia   4. Loss of voice       Return if symptoms worsen or fail to improve.

## 2014-08-25 ENCOUNTER — Encounter: Payer: Self-pay | Admitting: Family Medicine

## 2014-08-25 ENCOUNTER — Telehealth: Payer: Self-pay | Admitting: Family Medicine

## 2014-08-25 DIAGNOSIS — G478 Other sleep disorders: Secondary | ICD-10-CM

## 2014-08-25 DIAGNOSIS — R1310 Dysphagia, unspecified: Secondary | ICD-10-CM

## 2014-08-25 DIAGNOSIS — R491 Aphonia: Secondary | ICD-10-CM

## 2014-08-25 LAB — VITAMIN D 25 HYDROXY (VIT D DEFICIENCY, FRACTURES): VIT D 25 HYDROXY: 38 ng/mL (ref 30–100)

## 2014-08-25 NOTE — Telephone Encounter (Signed)
Amber, Will you please let patient know that all of her labs were normal. I'd recommend a referral to an ENT doctor and a home sleep test for further evaluation of her voice issue and sleep issue respectively.

## 2014-08-25 NOTE — Telephone Encounter (Signed)
Pt notified of results and referral to ENT.

## 2014-08-30 ENCOUNTER — Telehealth: Payer: Self-pay | Admitting: Family Medicine

## 2014-08-30 DIAGNOSIS — Z0182 Encounter for allergy testing: Secondary | ICD-10-CM

## 2014-08-30 NOTE — Telephone Encounter (Signed)
Pt notified of allergy referral.  I released her remaining results onto her mychart.  She requested to see an ENT in [place] so I've asked Arline Asp to send a new one.

## 2014-08-30 NOTE — Telephone Encounter (Signed)
Amber, I've placed an Allergy referral.  Can you please address the patient's other two requests and then let the patient know the status of her ENT referral?  ----- Message -----      From: Aileen Fass     Sent: 08/29/2014  7:56 PM      To: [facility]    Subject: RE: Visit Follow-Up Question                 Yes, I'd like to get the allergy testing done, could you put that referral through please?         Also, I have not heard from the ENT yet? Could you tell me who it is so I can call him/her?        Last thing, I have not seen my labs posted to MyChart yet...do you think they'll be posted tomorrow? I have another appointment tomorrow that I'd like to have those results available for so we don't duplicate efforts. Thanks.        Okey Dupre

## 2014-08-31 ENCOUNTER — Encounter: Payer: Self-pay | Admitting: Sports Medicine

## 2014-08-31 ENCOUNTER — Ambulatory Visit (INDEPENDENT_AMBULATORY_CARE_PROVIDER_SITE_OTHER): Payer: Managed Care, Other (non HMO) | Admitting: Sports Medicine

## 2014-08-31 VITALS — BP 117/75 | HR 55 | Ht 65.5 in | Wt 149.0 lb

## 2014-08-31 DIAGNOSIS — R058 Other specified cough: Secondary | ICD-10-CM

## 2014-08-31 DIAGNOSIS — R05 Cough: Secondary | ICD-10-CM

## 2014-08-31 DIAGNOSIS — R0602 Shortness of breath: Secondary | ICD-10-CM

## 2014-08-31 MED ORDER — DEXLANSOPRAZOLE 60 MG PO CPDR
60.0000 mg | DELAYED_RELEASE_CAPSULE | Freq: Every day | ORAL | Status: DC
Start: 2014-08-31 — End: 2014-11-12

## 2014-08-31 NOTE — Assessment & Plan Note (Addendum)
History of diagnosis of vocal cord dysfunction by ENT, has had some preexercise albuterol and Xopenex, has not had a long-acting bronchodilation or so typically by the time of the race her symptoms had returned. Has never had pre-/post exercise/postbronchodilator spirometry. 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 Spirometry shows normal pre-/post exercise/postbronchodilator spirometry ruling out the diagnosis of exercise-induced asthma. This raises the suspicion for vocal cord dysfunction, considering throat clearing we are going to add some Dexilant to rule out exercise induced laryngeal pharyngeal reflux Also recently had morning cortisol levels drawn looking for adrenal fatigue, this is suspected by a naturopath. She is going to his custom with otolaryngology the possibility of vocal cord dysfunction and probably needs direct visualization of the cords.

## 2014-08-31 NOTE — Progress Notes (Signed)
  Subjective:    CC: shortness of breath  HPI: This is a pleasant 39 year old female, she comes in with a long history of exercise induced symptoms of shortness of breath. She has seen an ENT, and has been diagnosed with vocal cord dysfunction, currently vocal cord dysfunction is the likely diagnosis. She has never had pre-/post exercise, postbronchodilator spirometry so this will be done today. She is also seeing a Naturopathic provider who suspects adrenal fatigue, and she tells me she recently had a morning cortisol level drawn today.  Certainly if this is positive she would be a candidate for a cosyntropin stimulation test.  Past medical history, Surgical history, Family history not pertinant except as noted below, Social history, Allergies, and medications have been entered into the medical record, reviewed, and no changes needed.   Review of Systems: No fevers, chills, night sweats, weight loss, chest pain, or shortness of breath.   Objective:    General: Well Developed, well nourished, and in no acute distress.  Neuro: Alert and oriented x3, extra-ocular muscles intact, sensation grossly intact.  HEENT: Normocephalic, atraumatic, pupils equal round reactive to light, neck supple, no masses, no lymphadenopathy, thyroid nonpalpable.  Skin: Warm and dry, no rashes. Cardiac: Regular rate and rhythm, no murmurs rubs or gallops, no lower extremity edema.  Respiratory: Clear to auscultation bilaterally. Not using accessory muscles, speaking in full sentences.  Preexercise spirometry was performed and was completely normal with regards to FVC, FEV1, FEV1/FVC, and vital capacity. The patient then ran 1 mile around the building, and post exercise spirometry was repeated, and was completely the same, we then did a bronchodilating nebulizer treatment, and postbronchodilator spirometry again remained the same.  Impression and Recommendations:    I spent 40 minutes with this patient, greater than  50% was face-to-face time counseling regarding the above diagnoses.  I then spent an additional 30 minutes with this patient, this was separate from the time used and documented above.

## 2014-09-09 ENCOUNTER — Encounter: Payer: Self-pay | Admitting: Sports Medicine

## 2014-09-27 ENCOUNTER — Encounter (HOSPITAL_BASED_OUTPATIENT_CLINIC_OR_DEPARTMENT_OTHER): Payer: Managed Care, Other (non HMO)

## 2014-10-06 ENCOUNTER — Other Ambulatory Visit: Payer: Self-pay | Admitting: Family Medicine

## 2014-10-06 ENCOUNTER — Encounter: Payer: Self-pay | Admitting: Family Medicine

## 2014-10-06 DIAGNOSIS — K219 Gastro-esophageal reflux disease without esophagitis: Secondary | ICD-10-CM | POA: Insufficient documentation

## 2014-10-08 ENCOUNTER — Encounter: Payer: Self-pay | Admitting: Family Medicine

## 2014-11-12 ENCOUNTER — Telehealth: Payer: Self-pay

## 2014-11-12 ENCOUNTER — Encounter: Payer: Self-pay | Admitting: Osteopathic Medicine

## 2014-11-12 ENCOUNTER — Ambulatory Visit (INDEPENDENT_AMBULATORY_CARE_PROVIDER_SITE_OTHER): Payer: Managed Care, Other (non HMO) | Admitting: Osteopathic Medicine

## 2014-11-12 VITALS — BP 115/71 | HR 57 | Wt 150.0 lb

## 2014-11-12 DIAGNOSIS — H60392 Other infective otitis externa, left ear: Secondary | ICD-10-CM

## 2014-11-12 MED ORDER — CIPROFLOXACIN-DEXAMETHASONE 0.3-0.1 % OT SUSP: 4.0000 [drp] | Freq: Two times a day (BID) | OTIC | Status: DC

## 2014-11-12 MED ORDER — NEOMYCIN-COLIST-HC-THONZONIUM 3.3-3-10-0.5 MG/ML OT SUSP: 3.0000 [drp] | Freq: Four times a day (QID) | OTIC | Status: DC

## 2014-11-12 NOTE — Progress Notes (Signed)
HPI: Dana Gomez is a 39 y.o. female who [facility]  [place] for chief complaint of:  Chief Complaint  Patient presents with  . Acute Visit    ?? ear infection--slept in ear plugs & awaken to ears itching really bad.    . Location: L ear . Quality: itching, discomfort . Severity: moderate . Duration: 1 day . Timing: constant . Context: sleeps with ear plug in, today felt itchy in L ear . Assoc signs/symptoms: no fever/chills, no sinus pressure   Past medical, social and family history reviewed: Past Medical History  Diagnosis Date  . 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 Schleral buckle  [id number]  . Nasal sinus surgery  1999   Social History  Substance Use Topics  . Smoking status: Never Smoker   . Smokeless tobacco: Not on file  . Alcohol Use: 1.2 oz/week    2 Glasses of wine per week   Family History  Problem Relation Age of Onset  . Breast cancer Maternal Grandmother   . Hyperlipidemia Mother   . Alcoholism Mother   . Hypertension Mother   . Alcoholism Father     Current Outpatient Prescriptions  Medication Sig Dispense Refill  . Ascorbic Acid (VITAMIN C PO) Take by mouth.    . Omega-3 Fatty Acids (FISH OIL PO) Take by mouth.    [name]. VITAMIN D, ERGOCALCIFEROL, PO Take by mouth.    . ciprofloxacin-dexamethasone (CIPRODEX) otic suspension Place 4 drops into the left ear 2 (two) times daily. FOR 7 DAYS 7.5 mL 0   No current facility-administered medications for this visit.   Allergies  Allergen Reactions  . Effexor [Venlafaxine] Other (See Comments)    Serotonin Syndrome  . Azithromycin Rash  . Erythromycin Rash      Review of Systems: CONSTITUTIONAL:  No  fever, no chills, No  unintentional weight changes HEAD/EYES/EARS/NOSE/THROAT: No headache, no vision change, no hearing change, No  sore throat, (+) itching as per HPI CARDIAC: No chest  pain RESPIRATORY: No  cough, No  shortness of breath/wheeze   Exam:  BP 115/71 mmHg  Pulse 57  Wt 150 lb (68.04 kg)  SpO2 96% Constitutional: VSS, see above. General Appearance: alert, well-developed, well-nourished, NAD Eyes: Normal lids and conjunctive, non-icteric sclera Ears, Nose, Mouth, Throat: Normal external inspection ears/nares/mouth/lips/gums, TM normal bilaterally, MMM, posterior pharynx No  erythema No  Exudate. (+) erythema L ear canal but TM clear and reflective, no effusion. Respiratory: Normal respiratory effort. no wheeze, no rhonchi, no rales Cardiovascular: S1/S2 normal, no murmur, no rub/gallop auscultated. RRR.     No results found for this or any previous visit (from the past 72 hour(s)).    ASSESSMENT/PLAN:  Otitis, externa, infective, left - Plan: ciprofloxacin-dexamethasone (CIPRODEX) otic suspension   Avoid ear plugs, try cotton balls, use new and clean earplug devices if still want to use these.   Return if symptoms worsen or fail to improve.

## 2014-11-12 NOTE — Telephone Encounter (Signed)
Patient cannot afford the eye drops. Could you prescribe something else.

## 2014-12-03 ENCOUNTER — Encounter: Payer: Self-pay | Admitting: Osteopathic Medicine

## 2014-12-03 ENCOUNTER — Ambulatory Visit (INDEPENDENT_AMBULATORY_CARE_PROVIDER_SITE_OTHER): Payer: Managed Care, Other (non HMO) | Admitting: Osteopathic Medicine

## 2014-12-03 VITALS — BP 116/69 | HR 50 | Temp 98.1°F | Ht 66.0 in | Wt 147.0 lb

## 2014-12-03 DIAGNOSIS — B379 Candidiasis, unspecified: Secondary | ICD-10-CM | POA: Diagnosis not present

## 2014-12-03 DIAGNOSIS — H6982 Other specified disorders of Eustachian tube, left ear: Secondary | ICD-10-CM | POA: Diagnosis not present

## 2014-12-03 DIAGNOSIS — H6992 Unspecified Eustachian tube disorder, left ear: Secondary | ICD-10-CM

## 2014-12-03 DIAGNOSIS — J019 Acute sinusitis, unspecified: Secondary | ICD-10-CM | POA: Diagnosis not present

## 2014-12-03 DIAGNOSIS — B9689 Other specified bacterial agents as the cause of diseases classified elsewhere: Secondary | ICD-10-CM

## 2014-12-03 MED ORDER — AMOXICILLIN-POT CLAVULANATE 875-125 MG PO TABS: 1.0000 | ORAL_TABLET | Freq: Two times a day (BID) | ORAL | Status: DC

## 2014-12-03 MED ORDER — FLUCONAZOLE 150 MG PO TABS: 150.0000 mg | ORAL_TABLET | Freq: Once | ORAL | Status: DC

## 2014-12-03 MED ORDER — FLUTICASONE PROPIONATE 50 MCG/ACT NA SUSP: 2.0000 | Freq: Every day | NASAL | Status: DC

## 2014-12-03 MED ORDER — IPRATROPIUM BROMIDE 0.03 % NA SOLN: 2.0000 | Freq: Two times a day (BID) | NASAL | Status: DC

## 2014-12-03 NOTE — Progress Notes (Signed)
HPI: Dana Gomez is a 39 y.o. female who [facility] for chief complaint of:  Chief Complaint  Patient presents with  . Sinus Problem    . Location: Sinuses and left ear . Quality: Congestion and ear fullness . Duration: 2-3 days . Timing: Constant . Context: Patient has history of recurrent sinusitis and multiple sinus surgeries, previously followed by ENT for left ear issues, never had surgery on the left ear. . Assoc signs/symptoms:  No fever or chills, no headache or vision changes   Past medical, social and family history reviewed: Past Medical History  Diagnosis Date  . 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 Schleral buckle  [id number],2005  . Nasal sinus surgery  1999   Social History  Substance Use Topics  . Smoking status: Never Smoker   . Smokeless tobacco: Not on file  . Alcohol Use: 1.2 oz/week    2 Glasses of wine per week   Family History  Problem Relation Age of Onset  . Breast cancer Maternal Grandmother   . Hyperlipidemia Mother   . Alcoholism Mother   . Hypertension Mother   . Alcoholism Father     Current Outpatient Prescriptions  Medication Sig Dispense Refill  . Ascorbic Acid (VITAMIN C PO) Take by mouth.    . Omega-3 Fatty Acids (FISH OIL PO) Take by mouth.    [name]. VITAMIN D, ERGOCALCIFEROL, PO Take by mouth.    [name]. amoxicillin-clavulanate (AUGMENTIN) 875-125 MG tablet Take 1 tablet by mouth 2 (two) times daily. 10 tablet 0  . fluconazole (DIFLUCAN) 150 MG tablet Take 1 tablet (150 mg total) by mouth once. 1 tablet 0  . fluticasone (FLONASE) 50 MCG/ACT nasal spray Place 2 sprays into both nostrils daily. 16 g 3  . ipratropium (ATROVENT) 0.03 % nasal spray Place 2 sprays into both nostrils every 12 (twelve) hours. 30 mL 0   No current facility-administered medications for this visit.   Allergies  Allergen Reactions  . Effexor [Venlafaxine]  Other (See Comments)    Serotonin Syndrome  . Azithromycin Rash  . Erythromycin Rash      Review of Systems: CONSTITUTIONAL:  No  fever, no chills, No  unintentional weight changes HEAD/EYES/EARS/NOSE/THROAT: No headache, no vision change, no hearing change, mild sore throat, congestion and ear fullness as per history of present illness CARDIAC: No chest pain, no pressure/palpitations, no orthopnea RESPIRATORY: Occasional dry cough, No  shortness of breath/wheeze GASTROINTESTINAL: No nausea, no vomiting, no abdominal pain, no blood in stool, no diarrhea, no constipation MUSCULOSKELETAL: No  myalgia/arthralgia   Exam:  BP 116/69 mmHg  Pulse 50  Temp(Src) 98.1 F (36.7 C) (Oral)  Ht 5\' 6"  (1.676 m)  Wt 147 lb (66.679 kg)  BMI 23.74 kg/m2 Constitutional: VSS, see above. General Appearance: alert, well-developed, well-nourished, NAD Eyes: Normal lids and conjunctive, non-icteric sclera, PERRLA Ears, Nose, Mouth, Throat: Normal external inspection ears/nares/mouth/lips/gums, TM Left ear questionable scar tissue, effusion but no hemotympanum or signs of acute infection." Tympanic membranes normal , MMM, posterior pharynx Yes  erythema No  exudate Neck: No masses, trachea midline. No thyroid enlargement/tenderness/mass appreciated. No lymphadenopathy Respiratory: Normal respiratory effort. no wheeze, no rhonchi, no rales Cardiovascular: S1/S2 normal, no murmur, no rub/gallop auscultated. RRR.    No results found for this or any previous visit (from the past 72 hour(s)).    ASSESSMENT/PLAN: Given history of multiple sinus surgeries and ear problems,  probably reasonable to try antibiotics and this person's case. Also given list of over-the-counter medications, advised utilize these to decrease inflammation and mucus production to avoid bacterial infection. If worse, advised follow-up with ENT.  Acute bacterial sinusitis - Plan: ipratropium (ATROVENT) 0.03 % nasal spray, fluticasone  (FLONASE) 50 MCG/ACT nasal spray, amoxicillin-clavulanate (AUGMENTIN) 875-125 MG tablet  Eustachian tube dysfunction, left - Plan: ipratropium (ATROVENT) 0.03 % nasal spray, fluticasone (FLONASE) 50 MCG/ACT nasal spray  Yeast infection - Plan: fluconazole (DIFLUCAN) 150 MG tablet    Return if symptoms worsen or fail to improve.

## 2014-12-03 NOTE — Patient Instructions (Signed)
DR. [facility] INSTRUCTIONS: UPPER RESPIRATORY ILLNESS AND SINUSITIS    TREAT SINUS CONGESTION, RUNNY NOSE & POSTNASAL DRIP: treat to increase airflow through sinuses, decrease congestion pain and prevent bacterial growth! Remember, only 0.5-2% of sinus infections are due to a bacteria, the rest are due to a virus (usually the common cold)! Trust your doctor when he or she decides whether or not you really need an antibiotic!   NASAL SPRAYS: generally safe, won't interact with other medicines. Can take any of these medications, either alone or together...  FLONASE (FLUTICASONE) - 2 sprays in each nostril twice per day  AFRIN (OXYMETOLAZONE) - use sparingly because it will cause rebound congestion, NEVER USE IN KIDS   SALINE NASAL SPRAY- no limit but avoid use after other nasal sprays  ANTIHISTAMINES: Helps dry out runny nose and decreases postnasal drip. Benadryl may cause drowsiness but other preparations should not be as sedating. Certain kinds are not as safe in elderly individuals. OK to use unless Dr A says otherwise.  Only use one of the following...  BENADRYL (DIPHENHYDRAMINE) - 25-50 mg every 6 hours  ZYRTEC (CETIRIZINE) - 5-10 mg daily  CLARITIN (LORATIDINE) - less potent. 10 mg daily  ALLEGRA (FEXOFENADINE) - least sedating! 180 mg daily or 60 mg twice per day  DECONGESTANTS:Helps dry out runny nose and helps with sinus pain. May cause insomnia, or sometimes elevated heart rate. Can cause problems if used often in people with high blood pressure. OK to use unless Dr A says otherwise.   SUDAFED (PSEUDOEPHEDINE) - 60 mg every 4 - 6 hours, also comes in 120 mg extended release every 12 hours, maximum 240 mg in 24 hours. NEVER USE IN KIDS UNDER 204 YEARS OLD.   Note: decongestants such as Sudafed and others are included in many drug combinations - read labels carefully and ask your pharmacist if you have any questions or concerns about interactions or buying duplicate  medicines!  COMBINATIONS OF ANTIHISTAMINES + DECONGESTANT: these often require you to show your ID at the pharmacy counter.  Only use one of the following.[name].  ZYRTEC-D (CETIRIZINE + PSEUDOEPHEDRINE) - 12 hour formulation as directed  CLARITIN-D (LORATIDINE + PSEUDOEPHEDRINE) - 12 and 24 hour formulations as directed  ALLEGRA-D (FEXOFENADINE + PSEUDOEPHEDRINE) - 12 and 24 hour formulations as directed  PRESCRIPTION TREATMENT Reserved for severe cases or true bacterial infection. See your medication list below! Can include nasal sprays, pills, or antibiotics in the case of true bacterial infection. Dr A may recommend certain over-the-counter medications in addition to prescriptions to help you feel better.     TREAT COUGH & SORE THROAT: Remember, cough is the body's way of protecting your airways and lungs, it's a hard-wired reflex that is tough for medicines to treat! Irritation to the airways will cause cough. This irritation is usually caused by upper airway problems like postnasal drip (treat as above) and sore throat, but in severe cases can be due to lower airway problems like bronchitis or pneumonia. Sore throat is almost always due to a virus, but occasionally can be due to Strep infection which requires antibiotics. Exercise and smoking may make cough worse - take it easy and QUIT SMOKING! Cough due to viral infection can linger for 2 weeks or so. If you're coughing longer than you think you should, or if the cough is severe, please make an appointment in the office, you may need a chest X-ray.   EXPECTORANT: Used to help clear airways, take these with PLENTY  of water to help thin mucus secretions and make the mucus easier to cough up  Only use one of the following.Lenn Sink (DEXTROMETHORPHAN OR GUAIFENISEN depending on formulation)   MUCINEX (GUAIFENICEN) - usually longer acting  Note: Robitussin and Mucinex come in many combinations, also - read labels carefully and ask your  pharmacist if you have any questions or concerns about interactions or buying duplicate medicines!  COUGH DROPS/LOZENGES: Whichever over-the-counter agent you prefer! Here are some suggestions for ingredients to look for...  BENZOCAINE - numbing effect, also in CHLORASEPTIC THROAT SPRAY  MENTHOL - cooling effect  HONEY: has gone head-to-head in several clinical trials with prescription cough medicines and found to be equally effective! Try 1 Teaspoon Honey + 2 Drops Lemon Juice, as much as you want to use. AVOID IN KIDS UNDER AGE 26  HERBAL TEA: There are certain ingredients which help "coat the throat" to relieve pain such as ELM BARK, LICORICE ROOT, MARSHMALLOW ROOT  PRESCRIPTION TREATMENT Reserved for severe cases. Can include pills, syrups or inhalers. See your medication list below!      TREAT ACHES AND PAINS, FEVER: Illness causes aches, pains and fever as your body increases inflammation response to help fight the infection. Rest, good hydration and nutrition, and taking anti-inflammatory medications will help. Remember: a true fever is a temperature 100.4 or higher. If you have a fever that is 105.0 or higher, that is a dangerous level and needs medical attention in the office or in the ER!     IBUPROFEN - 400-800 mg every 6 - 8 hours. Ibuprofen and similar medications can cause problems for people with heart disease or history of stomach ulcers, check with Dr A first if you're concerned. Lower doses are usually safe and effective.   TYLENOL (ACETAMINOPHEN) - [phone] mg every 4 - 6 hours. This won't cause problems with heart or stomach.   Note: Ibuprofen and Acetaminophen are often combined with many other cold medicines - read labels carefully and ask your pharmacist if you have any questions or concerns about interactions or buying duplicate medicines!     ANYTHING HIGHLIGHTED - YOU CAN TAKE THESE MEDICINES TOGETHER!     USE CAUTION - MANY OVER-THE-COUNTER MEDICATIONS COME  IN COMBINATIONS OF MULTIPLE GENERICS. FOR INSTANCE, NYQUIL HAS TYLENOL + ROBITUSSIN + AN ANTIHISTAMINE, SO BE CAREFUL YOU'RE NOT TAKING A COMBINATION MEDICINE WHICH CONTAINS MEDICATIONS YOU'RE ALSO TAKING SEPARATELY (LIKE NYQUIL SYRUP AS WELL AS TYLENOL PILLS). YOUR PHARMACIST CAN HELP YOU AVOID MEDICATION INTERACTIONS AND DUPLICATIONS - ASK FOR THEIR HELP IF YOU ARE CONFUSED OR UNSURE ABOUT WHAT TO PURCHASE OVER-THE-COUNTER!    REMEMBER - THE MOST IMPORTANT THINGS YOU CAN DO TO AVOID CATCHING OR SPREADING ILLNESS INCLUDE:   COVER YOUR COUGH WITH YOUR ARM  DISINFECT COMMONLY USED SURFACES SUCH AS TELEPHONES OR DOORKNOBS WHEN YOU OR SOMEONE LIVING OR WORKING WITH YOU IS SICK  BE SURE VACCINES ARE UP TO DATE - GET A FLU SHOT EVERY YEAR!  AND ABOVE ALL - WASH YOUR HANDS!   REMEMBER - IF YOU'RE EVER CONCERNED ABOUT MEDICATION SIDE EFFECTS, OR IF YOU'RE EVER CONCERNED YOUR SYMPTOMS ARE GETTING WORSE DESPITE TREATMENT, PLEASE CALL THE OFFICE!

## 2014-12-07 ENCOUNTER — Encounter: Payer: Self-pay | Admitting: Family Medicine

## 2014-12-22 ENCOUNTER — Institutional Professional Consult (permissible substitution): Payer: Managed Care, Other (non HMO) | Admitting: Internal Medicine

## 2015-01-03 ENCOUNTER — Encounter: Payer: Self-pay | Admitting: Sports Medicine

## 2015-01-11 ENCOUNTER — Ambulatory Visit (INDEPENDENT_AMBULATORY_CARE_PROVIDER_SITE_OTHER): Payer: Managed Care, Other (non HMO) | Admitting: Osteopathic Medicine

## 2015-01-11 ENCOUNTER — Encounter: Payer: Self-pay | Admitting: Osteopathic Medicine

## 2015-01-11 VITALS — BP 111/72 | HR 56 | Temp 98.3°F | Wt 149.0 lb

## 2015-01-11 DIAGNOSIS — H6092 Unspecified otitis externa, left ear: Secondary | ICD-10-CM | POA: Diagnosis not present

## 2015-01-11 MED ORDER — CLOTRIMAZOLE 1 % EX SOLN: 1.0000 "application " | Freq: Two times a day (BID) | CUTANEOUS | Status: DC

## 2015-01-11 NOTE — Progress Notes (Signed)
HPI: Dana Gomez is a 39 y.o. female [facility] for chief complaint of:  Chief Complaint  Patient presents with  . Ear Pain     . Location: L ear . Quality: itching . Severity: severe . Duration: 1 day, but this is recurremt           . Context: seen 11/12/14 and 12/03/14 for similar complaints. Rx with drops initially 11/12/14 then Rx Augmentin but never took this.  . Modifying factors: uses ear plugs at night due to husband snoring, she had to use these again last night after not having used them in some time. Reports she cleans them or uses new ones frequently.  . Assoc signs/symptoms: no hearing los, no pain, n fever/chills or sinus issues   Past medical, social and family history reviewed: Past Medical History  Diagnosis Date  . 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 Schleral buckle  [id number]  . Nasal sinus surgery  1999   Social History  Substance Use Topics  . Smoking status: Never Smoker   . Smokeless tobacco: Not on file  . Alcohol Use: 1.2 oz/week    2 Glasses of wine per week   Family History  Problem Relation Age of Onset  . Breast cancer Maternal Grandmother   . Hyperlipidemia Mother   . Alcoholism Mother   . Hypertension Mother   . Alcoholism Father     Current Outpatient Prescriptions  Medication Sig Dispense Refill  . amoxicillin-clavulanate (AUGMENTIN) 875-125 MG tablet Take 1 tablet by mouth 2 (two) times daily. 10 tablet 0  . Ascorbic Acid (VITAMIN C PO) Take by mouth.    . fluconazole (DIFLUCAN) 150 MG tablet Take 1 tablet (150 mg total) by mouth once. 1 tablet 0  . fluticasone (FLONASE) 50 MCG/ACT nasal spray Place 2 sprays into both nostrils daily. 16 g 3  . ipratropium (ATROVENT) 0.03 % nasal spray Place 2 sprays into both nostrils every 12 (twelve) hours. 30 mL 0  . Omega-3 Fatty Acids (FISH OIL PO) Take by mouth.    [name] VITAMIN D,  ERGOCALCIFEROL, PO Take by mouth.     No current facility-administered medications for this visit.   Allergies  Allergen Reactions  . Effexor [Venlafaxine] Other (See Comments)    Serotonin Syndrome  . Azithromycin Rash  . Erythromycin Rash      Review of Systems: CONSTITUTIONAL:  No  fever, no chills HEAD/EYES/EARS/NOSE/THROAT: No  headache, no vision change, no hearing change, No  sore throat, No  sinus pressure , ear complaints as per HPI CARDIAC: No  chest pain RESPIRATORY: No  cough, No  shortness of breath/wheeze     Exam:  BP 111/72 mmHg  Pulse 56  Temp(Src) 98.3 F (36.8 C) (Oral)  Wt 149 lb (67.586 kg) Constitutional: VS see above. General Appearance: alert, well-developed, well-nourished, NAD Eyes: Normal lids and conjunctive, non-icteric sclera Ears, Nose, Mouth, Throat: MMM, Normal external inspection ears/nares/mouth/lips/gums, TM abnormal: (+) erythema on L TM but landmarks and light reflex intact, L external canal (+) whitish clumpy material/irritation to skin c/w infection probably fungal versus bacterial though some possiility for contact dermatitis or eczema, R TM and canal normal, posterior pharynx No  erythema No  Exudate. Cultures collected of L external canal.  Neck: No masses, trachea midline. No thyroid enlargement/tenderness/mass appreciated. No lymphadenopathy       ASSESSMENT/PLAN: Test for fungal etiology and will  treat as such, previous abx drops effective but recurrence this time is a consideration for antifungals, Also clotrimazole acc to [employer] has some efficacy against s.aureus and s.epidermidis. Will refer to ENT as discussed at previous visits.   Otitis externa, left - Plan: Ear Culture, clotrimazole (LOTRIMIN) 1 % external solution, Fungus culture w smear, Ambulatory referral to ENT   Return if symptoms worsen or fail to improve.

## 2015-01-11 NOTE — Patient Instructions (Signed)
We are sending in referral to ENT, you should hear about making an appointment with them soon, please call our office if you don't hear from ENT this week.

## 2015-01-12 ENCOUNTER — Telehealth: Payer: Self-pay

## 2015-01-12 ENCOUNTER — Telehealth: Payer: Self-pay | Admitting: Osteopathic Medicine

## 2015-01-12 NOTE — Telephone Encounter (Signed)
Patient was recently seen and received an rx for lotromin solution.  Her insurance does not cover this and was wondering if there was another option?  Please advise.

## 2015-01-12 NOTE — Telephone Encounter (Signed)
Advised patient to use goodrx rebate card.  She will try this.

## 2015-01-12 NOTE — Telephone Encounter (Signed)
[facility], unfortunately I don't think there is a cheaper alternative. I have faxed this information over to her pharmacy, please call patient and let her know.

## 2015-01-12 NOTE — Telephone Encounter (Signed)
Error, please disregard.

## 2015-01-15 LAB — EAR CULTURE

## 2015-01-19 ENCOUNTER — Encounter: Payer: Self-pay | Admitting: Family Medicine

## 2015-01-19 DIAGNOSIS — B369 Superficial mycosis, unspecified: Secondary | ICD-10-CM | POA: Insufficient documentation

## 2015-01-19 DIAGNOSIS — H6240 Otitis externa in other diseases classified elsewhere, unspecified ear: Secondary | ICD-10-CM

## 2015-02-11 LAB — FUNGUS CULTURE W SMEAR

## 2015-04-11 ENCOUNTER — Encounter: Payer: Self-pay | Admitting: Family Medicine

## 2015-04-11 DIAGNOSIS — Z0000 Encounter for general adult medical examination without abnormal findings: Secondary | ICD-10-CM

## 2015-04-22 LAB — CBC
HEMATOCRIT: 44.4 % (ref 35.0–45.0)
Hemoglobin: 15.1 g/dL (ref 11.7–15.5)
MCH: 29.3 pg (ref 27.0–33.0)
MCHC: 34 g/dL (ref 32.0–36.0)
MCV: 86 fL (ref 80.0–100.0)
MPV: 10.3 fL (ref 7.5–12.5)
Platelets: 236 10*3/uL (ref 140–400)
RBC: 5.16 MIL/uL — ABNORMAL HIGH (ref 3.80–5.10)
RDW: 13 % (ref 11.0–15.0)
WBC: 6.1 10*3/uL (ref 3.8–10.8)

## 2015-04-22 LAB — COMPLETE METABOLIC PANEL WITH GFR
ALT: 17 U/L (ref 6–29)
AST: 15 U/L (ref 10–30)
Albumin: 4.3 g/dL (ref 3.6–5.1)
Alkaline Phosphatase: 40 U/L (ref 33–115)
BUN: 17 mg/dL (ref 7–25)
CALCIUM: 9.1 mg/dL (ref 8.6–10.2)
CHLORIDE: 102 mmol/L (ref 98–110)
CO2: 22 mmol/L (ref 20–31)
Creat: 0.78 mg/dL (ref 0.50–1.10)
GFR, Est Non African American: >89 mL/min (ref >=60)
Glucose, Bld: 86 mg/dL (ref 65–99)
POTASSIUM: 4.2 mmol/L (ref 3.5–5.3)
Sodium: 135 mmol/L (ref 135–146)
Total Bilirubin: 1.3 mg/dL — ABNORMAL HIGH (ref 0.2–1.2)
Total Protein: 6.7 g/dL (ref 6.1–8.1)

## 2015-04-22 LAB — LIPID PANEL
CHOL/HDL RATIO: 2.7 ratio (ref <=5.0)
CHOLESTEROL: 277 mg/dL — AB (ref 125–200)
HDL: 102 mg/dL (ref >=46)
LDL CALC: 163 mg/dL — AB (ref <130)
TRIGLYCERIDES: 61 mg/dL (ref <150)
VLDL: 12 mg/dL (ref <30)

## 2015-04-22 LAB — MAGNESIUM: MAGNESIUM: 2 mg/dL (ref 1.5–2.5)

## 2015-04-22 LAB — IRON AND TIBC
%SAT: 31 % (ref 11–50)
Iron: 113 ug/dL (ref 40–190)
TIBC: 369 ug/dL (ref 250–450)
UIBC: 256 ug/dL (ref 125–400)

## 2015-04-22 LAB — VITAMIN D 25 HYDROXY (VIT D DEFICIENCY, FRACTURES): Vit D, 25-Hydroxy: 71 ng/mL (ref 30–100)

## 2015-04-22 LAB — CORTISOL: Cortisol, Plasma: 24.9 ug/dL — ABNORMAL HIGH

## 2015-04-25 ENCOUNTER — Telehealth: Payer: Self-pay | Admitting: Family Medicine

## 2015-04-25 DIAGNOSIS — E270 Other adrenocortical overactivity: Principal | ICD-10-CM

## 2015-04-25 DIAGNOSIS — E785 Hyperlipidemia, unspecified: Secondary | ICD-10-CM

## 2015-04-25 DIAGNOSIS — R7989 Other specified abnormal findings of blood chemistry: Secondary | ICD-10-CM

## 2015-04-25 NOTE — Telephone Encounter (Signed)
Awaiting call back.

## 2015-04-25 NOTE — Telephone Encounter (Signed)
Will you please let patient know that her blood work was normal other than an elevated cortisol level and an elevated LDL cholesterol level.  OTC fish oil capsules can cause an elevated LDL so I'd recommend stopping any fish oil products and recheck a lipid panel in one month (lab in your box) and I'll place a referral to an endocrinologist to discuss the elevated cortisol level.

## 2015-04-26 NOTE — Telephone Encounter (Signed)
Pt.notified

## 2015-04-28 ENCOUNTER — Encounter: Payer: Self-pay | Admitting: Family Medicine

## 2015-04-28 ENCOUNTER — Ambulatory Visit (INDEPENDENT_AMBULATORY_CARE_PROVIDER_SITE_OTHER): Payer: Managed Care, Other (non HMO) | Admitting: Family Medicine

## 2015-04-28 VITALS — BP 124/82 | HR 58 | Wt 151.0 lb

## 2015-04-28 DIAGNOSIS — Z0000 Encounter for general adult medical examination without abnormal findings: Secondary | ICD-10-CM | POA: Diagnosis not present

## 2015-04-28 DIAGNOSIS — E270 Other adrenocortical overactivity: Secondary | ICD-10-CM | POA: Diagnosis not present

## 2015-04-28 DIAGNOSIS — R7989 Other specified abnormal findings of blood chemistry: Secondary | ICD-10-CM

## 2015-04-28 DIAGNOSIS — R531 Weakness: Secondary | ICD-10-CM

## 2015-04-28 MED ORDER — NEOMYCIN-POLYMYXIN-HC 1 % OT SOLN: OTIC | Status: AC

## 2015-04-28 NOTE — Progress Notes (Signed)
CC: Dana Gomez is a 40 y.o. female is here for Annual Exam   Subjective: HPI:  Colonoscopy: No current indication Papsmear: Normal 2014 doing 2 years Mammogram: Start at 6740   Influenza Vaccine: out of season Pneumovax: no indication Td/Tdap: UTD until next year Zoster: (Start 38 yo)  Requesting complete physical exam  Review of Systems - General ROS: negative for - chills, fever, night sweats, weight gain or weight loss Ophthalmic ROS: negative for - decreased vision Psychological ROS: negative for - anxiety or depression ENT ROS: negative for - hearing change, nasal congestion, tinnitus or allergies, positive for itching in the left ear canal Hematological and Lymphatic ROS: negative for - bleeding problems, bruising or swollen lymph nodes Breast ROS: negative Respiratory ROS: no cough, shortness of breath, or wheezing Cardiovascular ROS: no chest pain or dyspnea on exertion Gastrointestinal ROS: no abdominal pain, change in bowel habits, or black or bloody stools Genito-Urinary ROS: negative for - genital discharge, genital ulcers, incontinence or abnormal bleeding from genitals Musculoskeletal ROS: negative for - joint pain or muscle pain Neurological ROS: negative for - headaches or memory loss Dermatological ROS: negative for lumps, mole changes, rash and skin lesion changes  Past Medical History  Diagnosis Date  . 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 Schleral buckle  [id number],2005  . Nasal sinus surgery  1999   Family History  Problem Relation Age of Onset  . Breast cancer Maternal Grandmother   . Hyperlipidemia Mother   . Alcoholism Mother   . Hypertension Mother   . Alcoholism Father     Social History   Social History  . Marital Status: Married    Spouse Name: N/A  . Number of Children: N/A  . Years of Education: N/A   Occupational History  . Not on file.   Social History Main Topics  .  Smoking status: Never Smoker   . Smokeless tobacco: Not on file  . Alcohol Use: 1.2 oz/week    2 Glasses of wine per week  . Drug Use: No  . Sexual Activity: Yes   Other Topics Concern  . Not on file   Social History Narrative     Objective: BP 124/82 mmHg  Pulse 58  Wt 151 lb (68.493 kg)  General: No Acute Distress HEENT: Atraumatic, normocephalic, conjunctivae normal without scleral icterus.  No nasal discharge, hearing grossly intact, TMs with good landmarks bilaterally with no middle ear abnormalities, posterior pharynx clear without oral lesions. Neck: Supple, trachea midline, no cervical nor supraclavicular adenopathy. Pulmonary: Clear to auscultation bilaterally without wheezing, rhonchi, nor rales. Cardiac: Regular rate and rhythm.  No murmurs, rubs, nor gallops. No peripheral edema.  2+ peripheral pulses bilaterally. Abdomen: Bowel sounds normal.  No masses.  Non-tender without rebound.  Negative Murphy's sign. MSK: Grossly intact, no signs of weakness.  Full strength throughout upper and lower extremities.  Full ROM in upper and lower extremities.  No midline spinal tenderness. Neuro: Gait unremarkable, CN II-XII grossly intact.  C5-C6 Reflex 2/4 Bilaterally, L4 Reflex 2/4 Bilaterally.  Cerebellar function intact. Skin: No rashes. Psych: Alert and oriented to person/place/time.  Thought process normal. No anxiety/depression.   Assessment & Plan: Dana Gomez was seen today for annual exam.  Diagnoses and all orders for this visit:  Annual physical exam  Elevated cortisol level (HCC)  Other orders -     NEOMYCIN-POLYMYXIN-HYDROCORTISONE (CORTISPORIN) 1 % SOLN otic solution; Four drops in affected ear(s) three  times a day, keep in ear(s) for five minutes. Total of ten days.   Healthy lifestyle interventions including but not limited to regular exercise, a healthy low fat diet, moderation of salt intake, the dangers of tobacco/alcohol/recreational drug use, nutrition  supplementation, and accident avoidance were discussed with the patient and a handout was provided for future reference. Trial of Cortisporin to help with left ear itching   Return if symptoms worsen or fail to improve.

## 2015-05-04 LAB — MAGNESIUM, RBC: Magnesium RBC: 5 mg/dL (ref 4.0–6.4)

## 2015-05-10 NOTE — Addendum Note (Signed)
Addended by: Laren BoomHOMMEL, Jyssica Rief on: 05/10/2015 07:57 AM   Modules accepted: Orders

## 2015-06-09 ENCOUNTER — Ambulatory Visit (INDEPENDENT_AMBULATORY_CARE_PROVIDER_SITE_OTHER): Payer: Managed Care, Other (non HMO) | Admitting: Family Medicine

## 2015-06-09 ENCOUNTER — Encounter: Payer: Self-pay | Admitting: Family Medicine

## 2015-06-09 VITALS — BP 133/86 | HR 60 | Temp 99.0°F | Wt 149.0 lb

## 2015-06-09 DIAGNOSIS — H9202 Otalgia, left ear: Secondary | ICD-10-CM

## 2015-06-09 MED ORDER — TOBRAMYCIN-DEXAMETHASONE 0.3-0.1 % OP OINT: TOPICAL_OINTMENT | OPHTHALMIC | Status: DC

## 2015-06-09 NOTE — Progress Notes (Signed)
CC: Dana Gomez is a 40 y.o. female is here for left ear itching   Subjective: HPI:  Right ear itching present off and on ever since December of last year. She's tried clotrimazole, Ciprodex, neomycin/hydrocortisone, and fluocinolone all of which have been ineffective at treating her itching. She wakes up most days of the week due to the itching she localizes it to the middle of the ear canal. She feels moisture in her ear every day but never any other discharge. She denies any hearing loss or pain. She denies any nasal congestion sore throat cough or right ear discomfort   Review Of Systems Outlined In HPI  Past Medical History  Diagnosis Date  . 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 Schleral buckle  [id number],2005  . Nasal sinus surgery  1999   Family History  Problem Relation Age of Onset  . Breast cancer Maternal Grandmother   . Hyperlipidemia Mother   . Alcoholism Mother   . Hypertension Mother   . Alcoholism Father     Social History   Social History  . Marital Status: Married    Spouse Name: N/A  . Number of Children: N/A  . Years of Education: N/A   Occupational History  . Not on file.   Social History Main Topics  . Smoking status: Never Smoker   . Smokeless tobacco: Not on file  . Alcohol Use: 1.2 oz/week    2 Glasses of wine per week  . Drug Use: No  . Sexual Activity: Yes   Other Topics Concern  . Not on file   Social History Narrative     Objective: BP 133/86 mmHg  Pulse 60  Temp(Src) 99 F (37.2 C) (Oral)  Wt 149 lb (67.586 kg)  General: Alert and Oriented, No Acute Distress HEENT: Pupils equal, round, reactive to light. Conjunctivae clear.  The middle of the left ear canal has a thin patches white color wax otherwise canals clear with intact TMs with appropriate landmarks.  Middle ear appears open without effusion. Pink inferior turbinates.  Moist mucous membranes, pharynx without  inflammation nor lesions.  Neck supple without palpable lymphadenopathy nor abnormal masses. Lungs:  Clear and comfortablework of breathing Cardiac: Regular rate and rhythm.  Extremities: No peripheral edema.  Strong peripheral pulses.  Mental Status: No depression, anxiety, nor agitation. Skin: Warm and dry.  Assessment & Plan: Dana Gomez was seen today for left ear itching.  Diagnoses and all orders for this visit:  Left ear pain -     tobramycin-dexamethasone (TOBRADEX) ophthalmic ointment; Apply to left ear with Q-Tip three times a day for two weeks. (Opthalamic ointment intended for ear).   Left ear pain with the appearance of persistent otitis externa therefore start above optic ointment intentionally placed in the ear.  Return if symptoms worsen or fail to improve.

## 2015-06-14 ENCOUNTER — Ambulatory Visit: Payer: Managed Care, Other (non HMO) | Admitting: Endocrinology

## 2015-06-14 ENCOUNTER — Encounter: Payer: Self-pay | Admitting: Family Medicine

## 2015-06-15 NOTE — Telephone Encounter (Signed)
[facility] called requesting that there be a change to Dana Gomez medication.  Tobramycin ointment was sent to pharmacy for her last week and is too costly.  They are asking that tobramycin drops be sent in because there is a $90 difference in the prices. Please advise.

## 2015-06-16 ENCOUNTER — Encounter: Payer: Self-pay | Admitting: Family Medicine

## 2015-06-16 MED ORDER — DEXAMETHASONE 0.1 % OP SUSP: OPHTHALMIC | Status: DC

## 2015-06-16 MED ORDER — TOBRAMYCIN 0.3 % OP SOLN: OPHTHALMIC | Status: DC

## 2015-07-01 ENCOUNTER — Encounter: Payer: Self-pay | Admitting: Endocrinology

## 2015-07-01 ENCOUNTER — Ambulatory Visit (INDEPENDENT_AMBULATORY_CARE_PROVIDER_SITE_OTHER): Payer: Managed Care, Other (non HMO) | Admitting: Endocrinology

## 2015-07-01 VITALS — BP 118/74 | HR 55 | Temp 99.2°F | Ht 65.75 in | Wt 150.4 lb

## 2015-07-01 DIAGNOSIS — R61 Generalized hyperhidrosis: Secondary | ICD-10-CM

## 2015-07-01 DIAGNOSIS — R7989 Other specified abnormal findings of blood chemistry: Secondary | ICD-10-CM

## 2015-07-01 DIAGNOSIS — E270 Other adrenocortical overactivity: Secondary | ICD-10-CM | POA: Diagnosis not present

## 2015-07-01 MED ORDER — DEXAMETHASONE 1 MG PO TABS: ORAL_TABLET | ORAL | Status: DC

## 2015-07-01 NOTE — Patient Instructions (Addendum)
Two 24-HR urine tests are requested for you.  You can do them together.  Please do these first, then:  you should do a "dexamethasone suppression test."  for this, you would take dexamethasone 1 mg at 10 pm, then come in for a "cortisol" blood test the next morning before 9 am.  you do not need to be fasting for this test. I would be happy to see you back here as needed.

## 2015-07-01 NOTE — Progress Notes (Signed)
Subjective:    Patient ID: Dana Gomez, female    DOB: 27-May-1975, 40 y.o.   MRN: [id number]  HPI She reports she had an episode of serotonin syndrome in 2012, due to effexor.  She says during an athletic event later in 2012, she received epinephrine for stridor.  She now reports sxs of intermittent moderate tremor and assoc fatigue.  sxs are improved recently. She runs half marathons.  She says she was noted on 2016 to have elevated salivary cortisol.  She has no h/o pituitary dz, adrenal dz, or HTN.  She takes no steroid rx.   Past Medical History  Diagnosis Date  . 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 Schleral buckle  [id number],2005  . Nasal sinus surgery  1999    Social History   Social History  . Marital Status: Married    Spouse Name: N/A  . Number of Children: N/A  . Years of Education: N/A   Occupational History  . Not on file.   Social History Main Topics  . Smoking status: Never Smoker   . Smokeless tobacco: Not on file  . Alcohol Use: 1.2 oz/week    2 Glasses of wine per week     Comment: occ  . Drug Use: No  . Sexual Activity: Yes   Other Topics Concern  . Not on file   Social History Narrative    No current outpatient prescriptions on file prior to visit.   No current facility-administered medications on file prior to visit.    Allergies  Allergen Reactions  . Effexor [Venlafaxine] Other (See Comments)    Serotonin Syndrome  . Azithromycin Rash  . Erythromycin Rash    Family History  Problem Relation Age of Onset  . Breast cancer Maternal Grandmother   . Hyperlipidemia Mother   . Alcoholism Mother   . Hypertension Mother   . Alcoholism Father   . Adrenal disorder Neg Hx   . Other Neg Hx     BP 118/74 mmHg  Pulse 55  Temp(Src) 99.2 F (37.3 C) (Oral)  Ht 5' 5.75" (1.67 m)  Wt 150 lb 6 oz (68.21 kg)  BMI 24.46 kg/m2  SpO2 97%  LMP 06/19/2015   Review of Systems denies  weight gain, headache, hypomenorrhea, hirsutism, hair loss, polyuria, hyperpigmentation, cramps, numbness, easy bruising, and rash on the abdomen.  She has depression, decreased appetite, excessive diaphoresis, alternating heat and cold intolerance, doe, and insomnia.      Objective:   Physical Exam VS: see vs page GEN: no distress HEAD: head: no deformity eyes: no periorbital swelling, no proptosis external nose and ears are normal mouth: no lesion seen NECK: supple, thyroid is not enlarged CHEST WALL: no deformity LUNGS: clear to auscultation CV: reg rate and rhythm, no murmur ABD: abdomen is soft, nontender.  no hepatosplenomegaly.  not distended.  no hernia.  No striae.   MUSCULOSKELETAL: muscle bulk and strength are grossly normal.  no obvious joint swelling.  gait is normal and steady EXTEMITIES: no deformity.  no ulcer on the feet.  feet are of normal color and temp.  no edema PULSES: dorsalis pedis intact bilat.  no carotid bruit NEURO:  cn 2-12 grossly intact.   readily moves all 4's.  sensation is intact to touch on the feet SKIN:  Normal texture and temperature.  No rash or suspicious lesion is visible.  No hirsutism.  NODES:  None palpable at the  neck.  PSYCH: alert, well-oriented.  Does not appear anxious nor depressed.    Lab Results  Component Value Date   TSH 0.758 08/24/2014   I have reviewed outside records, and summarized:  Pt has reported above sxs for the past few years, and has had extensive w/u, including salivary cortisol.        Assessment & Plan:  Elevated salivary cortisol, new to me, prob physiologic, due to athletics. Depression and other sxs, unlikely endocrine-related.    Patient is advised the following: Patient Instructions  Two 24-HR urine tests are requested for you.  You can do them together.  Please do these first, then:  you should do a "dexamethasone suppression test."  for this, you would take dexamethasone 1 mg at 10 pm, then come in  for a "cortisol" blood test the next morning before 9 am.  you do not need to be fasting for this test. I would be happy to see you back here as needed.    Romero Belling, MD

## 2015-07-01 NOTE — Progress Notes (Signed)
Pre visit review using our clinic review tool, if applicable. No additional management support is needed unless otherwise documented below in the visit note. 

## 2015-07-05 NOTE — Addendum Note (Signed)
Addended by: Adline MangoSTONE-ELMORE, Grecia Lynk I on: 07/05/2015 09:34 AM   Modules accepted: Orders

## 2015-07-06 ENCOUNTER — Other Ambulatory Visit (INDEPENDENT_AMBULATORY_CARE_PROVIDER_SITE_OTHER): Payer: Managed Care, Other (non HMO)

## 2015-07-06 DIAGNOSIS — E270 Other adrenocortical overactivity: Secondary | ICD-10-CM

## 2015-07-06 DIAGNOSIS — R7989 Other specified abnormal findings of blood chemistry: Secondary | ICD-10-CM

## 2015-07-06 LAB — CORTISOL: Cortisol, Plasma: 0.7 ug/dL

## 2015-07-09 LAB — CATECHOLAMINES, FRACTIONATED, URINE, 24 HOUR
CALCULATED TOTAL (E+ NE): 50 ug/(24.h) (ref 26–121)
Creatinine, Urine mg/day-CATEUR: 1.9 g/(24.h) (ref 0.63–2.50)
DOPAMINE, 24 HR URINE: 236 ug/(24.h) (ref 52–480)
EPINEPHRINE, 24 HR URINE: 12 ug/(24.h) (ref 2–24)
Norepinephrine, 24 hr Ur: 38 mcg/24 h (ref 15–100)
TOTAL VOLUME - CF 24HR U: 4000 mL

## 2015-07-09 LAB — CORTISOL, URINE, 24 HOUR
Cortisol (Ur), Free: 34.1 mcg/24 h (ref 4.0–50.0)
RESULTS RECEIVED: 1.93 g/(24.h) (ref 0.63–2.50)

## 2015-07-09 LAB — METANEPHRINES, URINE, 24 HOUR
Metaneph Total, Ur: 336 mcg/24 h (ref 115–695)
Metanephrines, Ur: 126 mcg/24 h (ref 36–190)
Normetanephrine, 24H Ur: 210 mcg/24 h (ref 35–482)

## 2015-07-10 ENCOUNTER — Encounter: Payer: Self-pay | Admitting: Endocrinology

## 2015-07-15 ENCOUNTER — Encounter: Payer: Self-pay | Admitting: Family Medicine

## 2015-07-15 MED ORDER — BETAMETHASONE DIPROPIONATE AUG 0.05 % EX CREA: TOPICAL_CREAM | Freq: Two times a day (BID) | CUTANEOUS | Status: DC

## 2015-10-03 ENCOUNTER — Ambulatory Visit (INDEPENDENT_AMBULATORY_CARE_PROVIDER_SITE_OTHER): Payer: Managed Care, Other (non HMO)

## 2015-10-03 ENCOUNTER — Ambulatory Visit (INDEPENDENT_AMBULATORY_CARE_PROVIDER_SITE_OTHER): Payer: Managed Care, Other (non HMO) | Admitting: Family Medicine

## 2015-10-03 VITALS — BP 119/72 | HR 48 | Wt 153.0 lb

## 2015-10-03 DIAGNOSIS — R5383 Other fatigue: Secondary | ICD-10-CM

## 2015-10-03 DIAGNOSIS — R208 Other disturbances of skin sensation: Secondary | ICD-10-CM | POA: Diagnosis not present

## 2015-10-03 DIAGNOSIS — L299 Pruritus, unspecified: Secondary | ICD-10-CM | POA: Insufficient documentation

## 2015-10-03 DIAGNOSIS — R9431 Abnormal electrocardiogram [ECG] [EKG]: Secondary | ICD-10-CM | POA: Diagnosis not present

## 2015-10-03 DIAGNOSIS — R200 Anesthesia of skin: Secondary | ICD-10-CM

## 2015-10-03 NOTE — Progress Notes (Signed)
Dana Gomez is a 40 y.o. female who [facility] today with multiple complaints.    Her primary complaint today is of generalized fatigue and increased dyspnea with exertion.  She is a very active woman and competes in Crossfit. Over the last few months she has had difficulty catching her breath during her crossfit classes.  She reports a steady decline in her performance.  She has cut her training schedule to less than 3 times per week.  She denies chest pain, palpitations, and syncope.  Denies difficulty sleeping.  Patient endorses decreased appetite as well.  Additionally, she is concerned because her husband had similar symptoms last year and was later diagnosed with viral pericarditis.  Patient also reports left ear itching since December 2016.  She has been treated with multiple antibiotics and antifungals without improvement.  She also saw an ENT physician who was uncertain of the underlying cause.  Patient applies betamethasone cream every couple days which improves the itching significantly.  She is concerned because the itching persists.  Her last complaint is numbness of the plantar sides of the great and 2nd right toes for the past week.  Several days prior she wore high heels for 10 hours straight.  The numbness has improved over the last few days and is only mild at this time.  Of note she has a remote history of Morton's neuroma which was treated with physical therapy.     Past Medical History:  Diagnosis Date  . 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 NASAL SINUS SURGERY  1999  . schleral buckle  [id number],2005   Social History  Substance Use Topics  . Smoking status: Never Smoker  . Smokeless tobacco: Not on file  . Alcohol use 1.2 oz/week    2 Glasses of wine per week     Comment: occ    family history includes Alcoholism in her father and mother; Breast cancer in her maternal grandmother; Hyperlipidemia in her mother; Hypertension in her mother.  ROS as above:  Medications: Current Outpatient Prescriptions  Medication Sig Dispense Refill  . augmented betamethasone dipropionate (DIPROLENE-AF) 0.05 % cream Apply topically 2 (two) times daily. For up to two weeks. 30 g 0   No current facility-administered medications for this visit.    Allergies  Allergen Reactions  . Effexor [Venlafaxine] Other (See Comments)    Serotonin Syndrome  . Azithromycin Rash  . Erythromycin Rash     Exam:  BP 119/72   Pulse (!) 48   Wt 153 lb (69.4 kg)   LMP 09/30/2015   BMI 24.88 kg/m  Gen: Well NAD HEENT: EOMI,  MMM, Erythematous left ear canal, normal TM Lungs: Normal work of breathing. CTABL Heart: Huston FoleyBrady but regular no MRG Abd: NABS, Soft. Nondistended, Nontender Exts: Brisk capillary refill, warm and well perfused.  Psych:  Normal mood and affect.  Normal mentation  [facility] 10/03/2015  Decreased Interest 2  Down, Depressed, Hopeless 0  PHQ - 2 Score 2  Altered sleeping 3  Tired, decreased energy 3  Change in appetite 3  Feeling bad or failure about yourself  1  Trouble concentrating 2  Moving slowly or fidgety/restless 1  Suicidal thoughts 0  PHQ-9 Score 15    12 Lead EKG interpretation:  Normal sinus rhythm at a rate of 56 bpm.  QTc 420.  T wave inversions  in V1-V6, III, and aVF.  No ST segment changes  Assessment and Plan: 40 y.o. female with:  1.  Generalized fatigue and decreased exercise tolerance of unclear etiology.  EKG showed nonspecific T wave inversions giving concern for cardiac etiology.   Iron deficiency could certainly be causing her symptoms as well. Also on the differential is hypothyroid given her decreased appetite, bradycardia, and fatigue.  Her PHQ 9 score was elevated, though it is unclear if depression could be causing her  symptoms or if it is as a result of her symptoms.   - Echocardiogram - Chest Xray - CBC, CMP - Iron, TIBC, and ferritin -TSH, T4 and T3,  - Vitamin D - Sed rate  2.  Left ear itching of unclear etiology.  Very unlikely to be from infection or cancer because of previous antibiotic regimen and benign otoscopic exam.  Psoriasis or other dermatologic conditions are a possibility.  Her symptoms are improved with betamethasone cream.  Will continue with that treatment regimen.  Follow up for possible biopsy or dermatology referral if her symptoms worsen.    3. Right great and 2nd toe numbness:  Likely secondary to nerve irritation after wearing high heels.  Encouraging that it is improving and almost resolve.  Will continue to monitor.    I independently interviewed and examined the patient. I was actively present and helped to create this note.  Clementeen Graham, MD   Orders Placed This Encounter  Procedures  . DG Chest 2 View    Order Specific Question:   Reason for exam:    Answer:   Fatigue abnormal EKG    Order Specific Question:   Is the patient pregnant?    Answer:   No    Order Specific Question:   Preferred imaging location?    Answer:   [facility]  . CBC  . Comprehensive metabolic panel    Order Specific Question:   Has the patient fasted?    Answer:   No  . Ferritin  . Hemoglobin A1c  . Iron and TIBC  . TSH  . T4, free  . T3, free  . VITAMIN D 25 Hydroxy (Vit-D Deficiency, Fractures)  . Sedimentation rate  . ECHOCARDIOGRAM COMPLETE    Abnormal EKG fatigue    Standing Status:   Future    Standing Expiration Date:   01/01/2017    Order Specific Question:   Where should this test be performed    Answer:   [facility]    Order Specific Question:   Complete or Limited study?    Answer:   Complete    Order Specific Question:   Does the patient have a known history of hypersensitivity to Perflutren (aka Hospital doctor for echocardiograms -  CHECK ALLERGIES)    Answer:   No    Order Specific Question:   ADMINISTER PERFLUTERN    Answer:   ADMINISTER PERFLUTREN    Order Specific Question:   Expected Date:    Answer:   1 week    Order Specific Question:   Reason for exam-Echo    Answer:   Other - See Comments Section    Discussed warning signs or symptoms. Please see discharge instructions. Patient expresses understanding. \

## 2015-10-03 NOTE — Addendum Note (Signed)
Addended by: Thom ChimesHENRY, Shemiah Rosch M on: 10/03/2015 03:44 PM   Modules accepted: Orders

## 2015-10-03 NOTE — Patient Instructions (Signed)
Thank you for coming in today. You should hear from heart ultrasound people soon.  Return in 1 month or sooner.  Avoid maximum exercise.   Get xray today.   Call or go to the emergency room if you get worse, have trouble breathing, have chest pains, or palpitations.

## 2015-10-04 LAB — VITAMIN D 25 HYDROXY (VIT D DEFICIENCY, FRACTURES): Vit D, 25-Hydroxy: 36 ng/mL (ref 30–100)

## 2015-10-04 LAB — IRON AND TIBC
%SAT: 48 % (ref 11–50)
Iron: 154 ug/dL (ref 40–190)
TIBC: 321 ug/dL (ref 250–450)
UIBC: 167 ug/dL (ref 125–400)

## 2015-10-04 LAB — CBC
HEMATOCRIT: 39.9 % (ref 35.0–45.0)
HEMOGLOBIN: 13.4 g/dL (ref 11.7–15.5)
MCH: 29.5 pg (ref 27.0–33.0)
MCHC: 33.6 g/dL (ref 32.0–36.0)
MCV: 87.7 fL (ref 80.0–100.0)
MPV: 10.2 fL (ref 7.5–12.5)
Platelets: 262 10*3/uL (ref 140–400)
RBC: 4.55 MIL/uL (ref 3.80–5.10)
RDW: 13.5 % (ref 11.0–15.0)
WBC: 5.3 10*3/uL (ref 3.8–10.8)

## 2015-10-04 LAB — COMPREHENSIVE METABOLIC PANEL
ALBUMIN: 4.4 g/dL (ref 3.6–5.1)
ALT: 20 U/L (ref 6–29)
AST: 20 U/L (ref 10–30)
Alkaline Phosphatase: 40 U/L (ref 33–115)
BILIRUBIN TOTAL: 1 mg/dL (ref 0.2–1.2)
BUN: 15 mg/dL (ref 7–25)
CALCIUM: 9.1 mg/dL (ref 8.6–10.2)
CO2: 22 mmol/L (ref 20–31)
Chloride: 104 mmol/L (ref 98–110)
Creat: 0.75 mg/dL (ref 0.50–1.10)
Glucose, Bld: 85 mg/dL (ref 65–99)
Potassium: 4.1 mmol/L (ref 3.5–5.3)
Sodium: 137 mmol/L (ref 135–146)
Total Protein: 6.6 g/dL (ref 6.1–8.1)

## 2015-10-04 LAB — SEDIMENTATION RATE: SED RATE: 1 mm/h (ref 0–20)

## 2015-10-04 LAB — HEMOGLOBIN A1C
HEMOGLOBIN A1C: 4.9 % (ref <5.7)
MEAN PLASMA GLUCOSE: 94 mg/dL

## 2015-10-04 LAB — T4, FREE: Free T4: 1.1 ng/dL (ref 0.8–1.8)

## 2015-10-04 LAB — FERRITIN: Ferritin: 37 ng/mL (ref 10–154)

## 2015-10-04 LAB — T3, FREE: T3 FREE: 3.1 pg/mL (ref 2.3–4.2)

## 2015-10-04 LAB — TSH: TSH: 0.4 mIU/L

## 2015-10-12 ENCOUNTER — Other Ambulatory Visit (HOSPITAL_BASED_OUTPATIENT_CLINIC_OR_DEPARTMENT_OTHER): Payer: Managed Care, Other (non HMO)

## 2015-10-13 ENCOUNTER — Ambulatory Visit (HOSPITAL_COMMUNITY)
Admission: RE | Admit: 2015-10-13 | Discharge: 2015-10-13 | Disposition: A | Discharge status: Home / Self Care | Payer: Managed Care, Other (non HMO) | Source: Ambulatory Visit | Attending: Family Medicine | Admitting: Family Medicine

## 2015-10-13 DIAGNOSIS — R0602 Shortness of breath: Secondary | ICD-10-CM

## 2015-10-13 DIAGNOSIS — R9431 Abnormal electrocardiogram [ECG] [EKG]: Secondary | ICD-10-CM | POA: Diagnosis not present

## 2015-10-13 DIAGNOSIS — R5383 Other fatigue: Secondary | ICD-10-CM | POA: Diagnosis present

## 2015-10-13 DIAGNOSIS — I340 Nonrheumatic mitral (valve) insufficiency: Secondary | ICD-10-CM | POA: Insufficient documentation

## 2015-10-13 NOTE — Progress Notes (Signed)
  Echocardiogram 2D Echocardiogram has been performed.  Dana Gomez, Dana Gomez 10/13/2015, 9:56 AM

## 2015-10-14 ENCOUNTER — Telehealth: Payer: Self-pay | Admitting: Family Medicine

## 2015-10-14 ENCOUNTER — Encounter: Payer: Self-pay | Admitting: Family Medicine

## 2015-10-14 DIAGNOSIS — I272 Pulmonary hypertension, unspecified: Secondary | ICD-10-CM | POA: Insufficient documentation

## 2015-10-14 DIAGNOSIS — R5383 Other fatigue: Secondary | ICD-10-CM

## 2015-10-14 NOTE — Telephone Encounter (Signed)
Information discussed with pt. Pt had additional questions. See mychart message.

## 2015-10-14 NOTE — Telephone Encounter (Signed)
ECHO shows elevated pulmonary artery pressure. I think seeing a cardiologist is a good idea. You should hear from them soon. This could explain your fatigue.

## 2015-10-20 ENCOUNTER — Encounter: Payer: Self-pay | Admitting: Family Medicine

## 2015-10-24 ENCOUNTER — Encounter: Payer: Self-pay | Admitting: Family Medicine

## 2015-10-24 ENCOUNTER — Ambulatory Visit (INDEPENDENT_AMBULATORY_CARE_PROVIDER_SITE_OTHER): Payer: Managed Care, Other (non HMO) | Admitting: Family Medicine

## 2015-10-24 VITALS — BP 119/67 | HR 66 | Ht 65.75 in | Wt 151.0 lb

## 2015-10-24 DIAGNOSIS — H60392 Other infective otitis externa, left ear: Secondary | ICD-10-CM | POA: Diagnosis not present

## 2015-10-24 DIAGNOSIS — R21 Rash and other nonspecific skin eruption: Secondary | ICD-10-CM

## 2015-10-24 DIAGNOSIS — H9202 Otalgia, left ear: Secondary | ICD-10-CM

## 2015-10-24 MED ORDER — CIPROFLOXACIN-DEXAMETHASONE 0.3-0.1 % OT SUSP: 4.0000 [drp] | Freq: Two times a day (BID) | OTIC | 0 refills | Status: DC

## 2015-10-24 MED ORDER — PREDNISONE 20 MG PO TABS: 40.0000 mg | ORAL_TABLET | Freq: Every day | ORAL | 0 refills | Status: DC

## 2015-10-24 NOTE — Progress Notes (Signed)
Subjective:    Patient ID: Dana Gomez, female    DOB: [birth year], 40 y.o.   MRN: [id number]  HPI Left outer ear pain and itching x 1 year. Has seen ENT in the past and tried her on a steroid cream for dermatitis.  A week ago woke up with it feeling clogged and then last night the pain became worse.  No fever, or URI sxs.    RAsh x 5 weeks.  Just moved into a new house. Started on her ankles and them gradually moved upward and inside of her thighs.  Brought pics in. No new soaps, lotions, etc.  No one else affected at home.   Says its extremely itchy.   Review of Systems  BP 119/67   Pulse 66   Ht 5' 5.75" (1.67 m)   Wt 151 lb (68.5 kg)   LMP 09/30/2015   SpO2 100%   BMI 24.56 kg/m     Allergies  Allergen Reactions  . Effexor [Venlafaxine] Other (See Comments)    Serotonin Syndrome  . Azithromycin Rash  . Erythromycin Rash    Past Medical History:  Diagnosis Date  . 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 NASAL SINUS SURGERY  1999  . schleral buckle  [id number],2005    Social History   Social History  . Marital status: Married    Spouse name: N/A  . Number of children: N/A  . Years of education: N/A   Occupational History  . Not on file.   Social History Main Topics  . Smoking status: Never Smoker  . Smokeless tobacco: Not on file  . Alcohol use 1.2 oz/week    2 Glasses of wine per week     Comment: occ  . Drug use: No  . Sexual activity: Yes   Other Topics Concern  . Not on file   Social History Narrative  . No narrative on file    Family History  Problem Relation Age of Onset  . Breast cancer Maternal Grandmother   . Hyperlipidemia Mother   . Alcoholism Mother   . Hypertension Mother   . Alcoholism Father   . Adrenal disorder Neg Hx   . Other Neg Hx     Outpatient Encounter Prescriptions as of 10/24/2015  Medication Sig  . augmented betamethasone dipropionate (DIPROLENE-AF) 0.05 %  cream Apply topically 2 (two) times daily. For up to two weeks.  . ciprofloxacin-dexamethasone (CIPRODEX) otic suspension Place 4 drops into the left ear 2 (two) times daily. X 7 days  . predniSONE (DELTASONE) 20 MG tablet Take 2 tablets (40 mg total) by mouth daily.   No facility-administered encounter medications on file as of 10/24/2015.          Objective:   Physical Exam  Constitutional: She is oriented to person, place, and time. She appears well-developed and well-nourished.  HENT:  Head: Normocephalic and atraumatic.  Right Ear: External ear normal.  Left Ear: External ear normal.  Nose: Nose normal.  Mouth/Throat: Oropharynx is clear and moist.  Right TM and canals are clear. Left Canal with white debris.  Light reflex is distorted but no sign of otitis interna.  Tender with tug at that tragus.    Eyes: Conjunctivae and EOM are normal. Pupils are equal, round, and reactive to light.  Neck: Neck supple. No thyromegaly present.  Cardiovascular: Normal rate, regular rhythm and normal heart sounds.   Pulmonary/Chest: Effort normal and breath  sounds normal. She has no wheezes.  Lymphadenopathy:    She has no cervical adenopathy.  Neurological: She is alert and oriented to person, place, and time.  Skin: Skin is warm and dry.  Fine erythematous rash. Mostly 2-65mm macular.  Some are excoriated.    Psychiatric: She has a normal mood and affect.           Assessment & Plan:  Left ear OE - Will treat with Ciprodex drops. Call if not better after one week.  Rash - Unclear etiology. She is getting her home checked for bedbugs. Nobody else has had similar rash her symptoms but they did move recently and used movers who used moving blankets around their furniture. Treat with oral prednisone to reduce irritation and itching. Encouraged her to moisturize the skin well. She does have a place near her elbow that looks more eczematous.

## 2015-10-25 ENCOUNTER — Ambulatory Visit: Payer: Self-pay | Admitting: Family Medicine

## 2015-10-31 ENCOUNTER — Ambulatory Visit: Payer: Managed Care, Other (non HMO) | Admitting: Family Medicine

## 2015-11-02 ENCOUNTER — Encounter: Payer: Self-pay | Admitting: Family Medicine

## 2015-11-03 ENCOUNTER — Encounter: Payer: Self-pay | Admitting: Family Medicine

## 2015-11-07 NOTE — Progress Notes (Signed)
    Referring: Clementeen Grahamorey, Evan MD  HPI: 40 year old female for evaluation of pulmonary hypertension and dyspnea. PFTs 8/16 normal. Echocardiogram September 2017 showed normal LV systolic and diastolic function. Mild left atrial enlargement. Mildly elevated pulmonary pressures. Chest x-ray September 2017 showed no active cardiopulmonary disease. Laboratories September 2017 showed normal thyroid functions and hemoglobin. Patient states that since June she has had fatigue with vigorous activities. 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 She has not had problems with this previously but notices the above symptoms at present. There is no orthopnea, PND or syncope. She occasionally feels her heart pounding that is unpredictable and not related to exertion. Because of the above we were asked to evaluate.  Current Outpatient Prescriptions  Medication Sig Dispense Refill  . Cholecalciferol (VITAMIN D PO) Take by mouth daily.    . IRON PO Take by mouth daily.     No current facility-administered medications for this visit.      Past Medical History:  Diagnosis Date  . Asthma   . Hyperlipidemia   . 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 NASAL SINUS SURGERY  1999  . schleral buckle  [id number],2005    Social History   Social History  . Marital status: Married    Spouse name: N/A  . Number of children: N/A  . Years of education: N/A   Occupational History  . Not on file.   Social History Main Topics  . Smoking status: Never Smoker  . Smokeless tobacco: Never Used  . Alcohol use 1.2 oz/week    2 Glasses of wine per week     Comment: occ  . Drug use: No  . Sexual activity: Yes   Other Topics Concern  . Not on file   Social History Narrative  . No narrative on file    Family History  Problem Relation Age of Onset  . Breast cancer Maternal Grandmother   . Hyperlipidemia Mother   .  Alcoholism Mother   . Hypertension Mother   . Alcoholism Father   . Adrenal disorder Neg Hx   . Other Neg Hx     ROS: no fevers or chills, productive cough, hemoptysis, dysphasia, odynophagia, melena, hematochezia, dysuria, hematuria, rash, seizure activity, orthopnea, PND, pedal edema, claudication. Remaining systems are negative.  Physical Exam: Well-developed well-nourished in no acute distress.  Skin is warm and dry.  HEENT is normal.  Neck is supple.  Chest is clear to auscultation with normal expansion.  Cardiovascular exam is regular rate and rhythm.  Abdominal exam nontender or distended. No masses palpated. Extremities show no edema. neuro grossly intact  ECG 10/03/2015-marked sinus bradycardia with nonspecific T-wave changes.  A/P  1 dyspnea-etiology unclear. Echocardiogram shows preserved LV function and probable upper normal pulmonary pressures. She does travel frequently and I will arrange a d-dimer to screen for pulmonary embolus. She does not have chest pain.   2 palpitations-I will arrange a 48 hour Holter monitor to further assess. She has symptoms at least every 48 hours Note thyroid functions and hemoglobin normal.   3 Hyperlipidemia-management per primary care.   Olga MillersBrian Jolynda Townley, MD

## 2015-11-09 ENCOUNTER — Encounter: Payer: Self-pay | Admitting: Cardiology

## 2015-11-09 ENCOUNTER — Ambulatory Visit (INDEPENDENT_AMBULATORY_CARE_PROVIDER_SITE_OTHER): Payer: Managed Care, Other (non HMO) | Admitting: Cardiology

## 2015-11-09 VITALS — BP 136/78 | HR 64 | Ht 65.75 in | Wt 152.0 lb

## 2015-11-09 DIAGNOSIS — R002 Palpitations: Secondary | ICD-10-CM

## 2015-11-09 DIAGNOSIS — R0600 Dyspnea, unspecified: Secondary | ICD-10-CM | POA: Diagnosis not present

## 2015-11-09 NOTE — Patient Instructions (Signed)
Medication Instructions:   NO CHANGE  Labwork:  Your physician recommends that you HAVE LAB WORK TODAY  Testing/Procedures:  Your physician has recommended that you wear a 48 HOUR holter monitor. Holter monitors are medical devices that record the heart's electrical activity. Doctors most often use these monitors to diagnose arrhythmias. Arrhythmias are problems with the speed or rhythm of the heartbeat. The monitor is a small, portable device. You can wear one while you do your normal daily activities. This is usually used to diagnose what is causing palpitations/syncope (passing out).    Follow-Up:  Your physician recommends that you schedule a follow-up appointment in: AS NEEDED PENDING TEST RESULTS

## 2015-11-10 LAB — D-DIMER, QUANTITATIVE (NOT AT ARMC): D DIMER QUANT: 0.33 ug{FEU}/mL (ref <0.50)

## 2015-11-25 ENCOUNTER — Encounter: Payer: Self-pay | Admitting: Family Medicine

## 2015-11-25 MED ORDER — FLUTICASONE PROPIONATE 50 MCG/ACT NA SUSP: 2.0000 | Freq: Every day | NASAL | 2 refills | Status: DC

## 2015-12-05 ENCOUNTER — Encounter: Payer: Self-pay | Admitting: Cardiology

## 2015-12-06 ENCOUNTER — Encounter: Payer: Self-pay | Admitting: Family Medicine

## 2015-12-07 NOTE — Telephone Encounter (Signed)
Left VM advising referral for Pt's daughter, Natalia LeatherwoodKatherine, has been placed.

## 2016-01-02 ENCOUNTER — Encounter: Payer: Self-pay | Admitting: Family Medicine

## 2016-01-19 ENCOUNTER — Encounter: Payer: Self-pay | Admitting: Family Medicine

## 2016-01-19 DIAGNOSIS — J029 Acute pharyngitis, unspecified: Secondary | ICD-10-CM

## 2016-01-23 LAB — EPSTEIN-BARR VIRUS VCA ANTIBODY PANEL
EBV NA IGG: 30.4 U/mL — AB
EBV VCA IGG: 107 U/mL — AB

## 2016-04-04 ENCOUNTER — Encounter: Payer: Self-pay | Admitting: Family Medicine

## 2016-07-09 ENCOUNTER — Encounter: Payer: Self-pay | Admitting: Family Medicine

## 2016-07-16 ENCOUNTER — Ambulatory Visit (INDEPENDENT_AMBULATORY_CARE_PROVIDER_SITE_OTHER): Payer: Managed Care, Other (non HMO) | Admitting: Family Medicine

## 2016-07-16 ENCOUNTER — Ambulatory Visit (INDEPENDENT_AMBULATORY_CARE_PROVIDER_SITE_OTHER): Payer: Managed Care, Other (non HMO)

## 2016-07-16 VITALS — BP 127/78 | HR 98 | Temp 98.6°F | Wt 155.0 lb

## 2016-07-16 DIAGNOSIS — R1013 Epigastric pain: Secondary | ICD-10-CM | POA: Diagnosis not present

## 2016-07-16 DIAGNOSIS — H6982 Other specified disorders of Eustachian tube, left ear: Secondary | ICD-10-CM

## 2016-07-16 DIAGNOSIS — M79644 Pain in right finger(s): Secondary | ICD-10-CM | POA: Diagnosis not present

## 2016-07-16 DIAGNOSIS — M659 Synovitis and tenosynovitis, unspecified: Secondary | ICD-10-CM | POA: Diagnosis not present

## 2016-07-16 LAB — CBC
HCT: 41.9 % (ref 35.0–45.0)
HEMOGLOBIN: 13.9 g/dL (ref 11.7–15.5)
MCH: 29.3 pg (ref 27.0–33.0)
MCHC: 33.2 g/dL (ref 32.0–36.0)
MCV: 88.4 fL (ref 80.0–100.0)
MPV: 9.9 fL (ref 7.5–12.5)
PLATELETS: 270 10*3/uL (ref 140–400)
RBC: 4.74 MIL/uL (ref 3.80–5.10)
RDW: 13.2 % (ref 11.0–15.0)
WBC: 6.5 10*3/uL (ref 3.8–10.8)

## 2016-07-16 LAB — COMPLETE METABOLIC PANEL WITH GFR
ALK PHOS: 41 U/L (ref 33–115)
ALT: 13 U/L (ref 6–29)
AST: 17 U/L (ref 10–30)
Albumin: 4.5 g/dL (ref 3.6–5.1)
BUN: 15 mg/dL (ref 7–25)
CHLORIDE: 102 mmol/L (ref 98–110)
CO2: 24 mmol/L (ref 20–31)
Calcium: 9.6 mg/dL (ref 8.6–10.2)
Creat: 0.84 mg/dL (ref 0.50–1.10)
GFR, Est African American: >89 mL/min (ref >=60)
GFR, Est Non African American: 88 mL/min (ref >=60)
GLUCOSE: 84 mg/dL (ref 65–99)
POTASSIUM: 4.2 mmol/L (ref 3.5–5.3)
SODIUM: 137 mmol/L (ref 135–146)
Total Bilirubin: 0.8 mg/dL (ref 0.2–1.2)
Total Protein: 7 g/dL (ref 6.1–8.1)

## 2016-07-16 LAB — LIPASE: Lipase: 30 U/L (ref 7–60)

## 2016-07-16 MED ORDER — MONTELUKAST SODIUM 10 MG PO TABS: 10.0000 mg | ORAL_TABLET | Freq: Every day | ORAL | 3 refills | Status: DC

## 2016-07-16 MED ORDER — DICLOFENAC SODIUM 1 % TD GEL: 2.0000 g | Freq: Four times a day (QID) | TRANSDERMAL | 11 refills | Status: DC

## 2016-07-16 NOTE — Progress Notes (Signed)
Dana Gomez is a 41 y.o. female who [facility] Medicine today for abdominal pain ear fullness and hand pain.  Abdominal pain: She has had epigastric abdominal pain and early satiety for several months. Has worsened over the past 2 weeks. She notes the pain is worse after eating. She denies any vomiting or diarrhea or blood in the stool. She has a normal bowel movement daily. She has not tried any medications for her symptoms yet.   Hand pain: Patient is right-hand dominant and notes pain in her second MCP and second metacarpal. She denies any injury but notes symptoms are worse with activity and better with rest. The pain is now interfering with her ability to open jars and lift weights.  Ear fullness. Dana Gomez is sensation of ear obstruction on the left side for sometime now. She's been seen by ENT in March was suspected eustachian tube dysfunction. She's tried oral antihistamines and nasal steroids which only helped a little. She denies fevers or chills. She does note decreased hearing in her left ear.   Past Medical History:  Diagnosis Date  . Asthma   . Hyperlipidemia   . 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 NASAL SINUS SURGERY  1999  . schleral buckle  [id number],2005   Social History  Substance Use Topics  . Smoking status: Never Smoker  . Smokeless tobacco: Never Used  . Alcohol use 1.2 oz/week    2 Glasses of wine per week     Comment: occ   family history includes Alcoholism in her father and mother; Breast cancer in her maternal grandmother; Hyperlipidemia in her mother; Hypertension in her mother.  ROS as above:  Medications: Current Outpatient Prescriptions  Medication Sig Dispense Refill  . Cholecalciferol (VITAMIN D PO) Take by mouth daily.    . diclofenac sodium (VOLTAREN) 1 % GEL Apply 2 g  topically 4 (four) times daily. To affected joint. 100 g 11  . fluticasone (FLONASE) 50 MCG/ACT nasal spray Place 2 sprays into both nostrils daily. 16 g 2  . IRON PO Take by mouth daily.    . montelukast (SINGULAIR) 10 MG tablet Take 1 tablet (10 mg total) by mouth at bedtime. 90 tablet 3   No current facility-administered medications for this visit.    Allergies  Allergen Reactions  . Effexor [Venlafaxine] Other (See Comments)    Serotonin Syndrome  . Azithromycin Rash  . Erythromycin Rash    Health Maintenance Health Maintenance  Topic Date Due  . PAP SMEAR  04/03/2015  . TETANUS/TDAP  02/07/2016  . INFLUENZA VACCINE  08/15/2016  . HIV Screening  Completed     Exam:  BP 127/78 (BP Location: Left Arm, Patient Position: Sitting, Cuff Size: Normal)   Pulse 98   Temp 98.6 F (37 C) (Oral)   Wt 155 lb (70.3 kg)   SpO2 98%   BMI 25.21 kg/m  Gen: Well NAD HEENT: EOMI,  MMM Right tympanic membrane is normal. Left tympanic membrane is rough white and cloudy.  No mastoid tenderness normal posterior pharynx  Lungs: Normal work of breathing. CTABL Heart: RRR no MRG Abd: NABS, Soft. Nondistended, Nontender Exts: Brisk capillary refill, warm and well perfused.  Right hand normal-appearing tender to palpation second MCP. Normal hand motion pulses capillary refill and sensation are intact.  X-ray right hand pending   No results found for this or any  previous visit (from the past 72 hour(s)). No results found.    Assessment and Plan: 41 y.o. female with  Epigastric abdominal pain and early satiety. Plan for workup including CBC metabolic panel lipase and H. pylori breath test. We'll also obtain an abdominal ultrasound to evaluate for gallbladder etiology. If workup comes back negative would do a trial of Bentyl.  Ear Symptoms:  Patient is symptomatic and has an abnormal appearing eardrum that was not noted on the last ENT note. I think it's reasonable for her to follow back up  with ENT for repeat evaluation. 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   Hand pain: Patient has pain at the second MCP likely due to posttraumatic synovitis. Will treat with diclofenac gel and workup with an x-ray today. If no better will refer to hand therapy.   Orders Placed This Encounter  Procedures  . US Abdomen Complete    Standing Status:   Future    Standing Expiration Date:   09/16/2017    Order Specific Question:   Reason for Exam (SYMPTOM  OR DIAGNOSIS REQUIRED)    Answer:   eval epigastric pain    Order Specific Question:   Preferred imaging location?    Answer:   [facility]  . DG Hand Complete Right    Standing Status:   Future    Number of Occurrences:   1    Standing Expiration Date:   09/16/2017    Order Specific Question:   Reason for Exam (SYMPTOM  OR DIAGNOSIS REQUIRED)    Answer:   eval pain 2nd MCP    Order Specific Question:   Is patient pregnant?    Answer:   No    Order Specific Question:   Preferred imaging location?    Answer:   [facility]    Order Specific Question:   Radiology Contrast Protocol - do NOT remove file path    Answer:   [email or web address]  . CBC  . COMPLETE METABOLIC PANEL WITH GFR  . Lipase  . H. pylori breath test   Meds ordered this encounter  Medications  . DISCONTD: diclofenac sodium (VOLTAREN) 1 % GEL    Sig: Apply 2 g topically 4 (four) times daily. To affected joint.    Dispense:  100 g    Refill:  11  . DISCONTD: montelukast (SINGULAIR) 10 MG tablet    Sig: Take 1 tablet (10 mg total) by mouth at bedtime.    Dispense:  90 tablet    Refill:  3  . montelukast (SINGULAIR) 10 MG tablet    Sig: Take 1 tablet (10 mg total) by mouth at bedtime.    Dispense:  90 tablet    Refill:  3  . diclofenac sodium (VOLTAREN) 1 % GEL    Sig: Apply 2 g topically 4 (four) times daily. To affected joint.    Dispense:  100 g     Refill:  11     Discussed warning signs or symptoms. Please see discharge instructions. Patient expresses understanding.  I spent 40 minutes with this patient, greater than 50% was face-to-face time counseling regarding the above diagnosis.

## 2016-07-16 NOTE — Patient Instructions (Signed)
Thank you for coming in today. Get labs today and xray and ultrasound soon.  Use voltaren gel on the hand.   Add singulair for allergies.  Keep the cats out of the bedroom.    Abdominal Pain, Adult Abdominal pain can be caused by many things. Often, abdominal pain is not serious and it gets better with no treatment or by being treated at home. However, sometimes abdominal pain is serious. Your health care provider will do a medical history and a physical exam to try to determine the cause of your abdominal pain. Follow these instructions at home:  Take over-the-counter and prescription medicines only as told by your health care provider. Do not take a laxative unless told by your health care provider.  Drink enough fluid to keep your urine clear or pale yellow.  Watch your condition for any changes.  Keep all follow-up visits as told by your health care provider. This is important. Contact a health care provider if:  Your abdominal pain changes or gets worse.  You are not hungry or you lose weight without trying.  You are constipated or have diarrhea for more than 2-3 days.  You have pain when you urinate or have a bowel movement.  Your abdominal pain wakes you up at night.  Your pain gets worse with meals, after eating, or with certain foods.  You are throwing up and cannot keep anything down.  You have a fever. Get help right away if:  Your pain does not go away as soon as your health care provider told you to expect.  You cannot stop throwing up.  Your pain is only in areas of the abdomen, such as the right side or the left lower portion of the abdomen.  You have bloody or black stools, or stools that look like tar.  You have severe pain, cramping, or bloating in your abdomen.  You have signs of dehydration, such as: ? Dark urine, very little urine, or no urine. ? Cracked lips. ? Dry mouth. ? Sunken eyes. ? Sleepiness. ? Weakness. This information is not  intended to replace advice given to you by your health care provider. Make sure you discuss any questions you have with your health care provider. Document Released: 10/11/2004 Document Revised: 07/22/2015 Document Reviewed: 06/15/2015 Elsevier Interactive Patient Education  2017 [employer]

## 2016-07-18 LAB — H. PYLORI BREATH TEST: H. PYLORI BREATH TEST: NOT DETECTED

## 2016-07-23 ENCOUNTER — Encounter: Payer: Self-pay | Admitting: Family Medicine

## 2016-07-25 ENCOUNTER — Ambulatory Visit (INDEPENDENT_AMBULATORY_CARE_PROVIDER_SITE_OTHER): Payer: Managed Care, Other (non HMO)

## 2016-07-25 ENCOUNTER — Telehealth: Payer: Self-pay | Admitting: Family Medicine

## 2016-07-25 ENCOUNTER — Other Ambulatory Visit: Payer: Self-pay

## 2016-07-25 DIAGNOSIS — R109 Unspecified abdominal pain: Secondary | ICD-10-CM | POA: Diagnosis not present

## 2016-07-25 DIAGNOSIS — R1013 Epigastric pain: Secondary | ICD-10-CM

## 2016-07-25 MED ORDER — DICYCLOMINE HCL 10 MG PO CAPS: 10.0000 mg | ORAL_CAPSULE | Freq: Three times a day (TID) | ORAL | 1 refills | Status: DC

## 2016-07-25 NOTE — Telephone Encounter (Signed)
lvm informing pt that rx has been sent.Dana Gomez Lynetta' 

## 2016-07-25 NOTE — Telephone Encounter (Signed)
Bentyl sent to pharmacy. Please inform pt.

## 2016-08-21 ENCOUNTER — Encounter: Payer: Self-pay | Admitting: Family Medicine

## 2016-08-28 ENCOUNTER — Encounter: Payer: Self-pay | Admitting: Physician Assistant

## 2016-08-28 ENCOUNTER — Ambulatory Visit (INDEPENDENT_AMBULATORY_CARE_PROVIDER_SITE_OTHER): Payer: Managed Care, Other (non HMO) | Admitting: Physician Assistant

## 2016-08-28 VITALS — BP 132/77 | HR 59 | Wt 155.0 lb

## 2016-08-28 DIAGNOSIS — M4307 Spondylolysis, lumbosacral region: Secondary | ICD-10-CM

## 2016-08-28 DIAGNOSIS — M62838 Other muscle spasm: Secondary | ICD-10-CM | POA: Diagnosis not present

## 2016-08-28 DIAGNOSIS — M5126 Other intervertebral disc displacement, lumbar region: Secondary | ICD-10-CM | POA: Diagnosis not present

## 2016-08-28 MED ORDER — PREDNISONE 50 MG PO TABS: ORAL_TABLET | ORAL | 0 refills | Status: DC

## 2016-08-28 MED ORDER — MELOXICAM 15 MG PO TABS: 15.0000 mg | ORAL_TABLET | Freq: Every day | ORAL | 1 refills | Status: DC

## 2016-08-28 MED ORDER — ORPHENADRINE CITRATE ER 100 MG PO TB12: 100.0000 mg | ORAL_TABLET | Freq: Two times a day (BID) | ORAL | 1 refills | Status: DC
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
## 2016-08-31 ENCOUNTER — Encounter: Payer: Self-pay | Admitting: Physician Assistant

## 2016-08-31 MED ORDER — PREDNISONE 20 MG PO TABS: ORAL_TABLET | ORAL | 0 refills | Status: DC

## 2016-08-31 NOTE — Telephone Encounter (Signed)
I sent longer taper pack to try. Let me know how doing on Monday.

## 2017-03-05 ENCOUNTER — Ambulatory Visit (INDEPENDENT_AMBULATORY_CARE_PROVIDER_SITE_OTHER): Payer: Managed Care, Other (non HMO) | Admitting: Family Medicine

## 2017-03-05 ENCOUNTER — Encounter: Payer: Self-pay | Admitting: Family Medicine

## 2017-03-05 VITALS — BP 126/78 | HR 51 | Ht 65.5 in | Wt 156.0 lb

## 2017-03-05 DIAGNOSIS — Z23 Encounter for immunization: Secondary | ICD-10-CM | POA: Diagnosis not present

## 2017-03-05 DIAGNOSIS — E785 Hyperlipidemia, unspecified: Secondary | ICD-10-CM | POA: Insufficient documentation

## 2017-03-05 DIAGNOSIS — E782 Mixed hyperlipidemia: Secondary | ICD-10-CM | POA: Diagnosis not present

## 2017-03-05 DIAGNOSIS — Z0000 Encounter for general adult medical examination without abnormal findings: Secondary | ICD-10-CM

## 2017-03-05 DIAGNOSIS — Z0001 Encounter for general adult medical examination with abnormal findings: Secondary | ICD-10-CM | POA: Diagnosis not present

## 2017-03-05 DIAGNOSIS — E559 Vitamin D deficiency, unspecified: Secondary | ICD-10-CM | POA: Diagnosis not present

## 2017-03-05 NOTE — Progress Notes (Signed)
Dana Gomez is a 42 y.o. female who [facility] [place] Primary [facility] Medicine today for annual exam.  Patient has been doing fairly well recently with no acute changes in health. She has history of lumbar disc herniation and depression, but both have been under control recently. No back pain in several months. Takes prednisone and meloxicam when injured and this seems to help. SSRI resulted in serotonin syndrome in past, but not currently on SSRI now and doing well. States work is stressful but manageable. She states that it is not impacting quality of life at all. Does state that she has stretches of 2-3 days where she gets down, but is able to cope well.   Eats balanced healthy diet. Works out regularly and enjoys distance events and crossfit. Work is stressful. Feels like home life is going fine though. Denies tobacco use.   Health maintenance: sees OB/GYN for Pap. Discussed breast cancer screening - interested in started at 42. Needs TDap today.    Past Medical History:  Diagnosis Date  . Asthma   . Hyperlipidemia   . 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 NASAL SINUS SURGERY  1999  . schleral buckle  [id number],2005   Social History   Tobacco Use  . Smoking status: Never Smoker  . Smokeless tobacco: Never Used  Substance Use Topics  . Alcohol use: Yes    Alcohol/week: 1.2 oz    Types: 2 Glasses of wine per week    Comment: occ   family history includes Alcoholism in her father and mother; Breast cancer in her maternal grandmother; Hyperlipidemia in her mother; Hypertension in her mother.  ROS as above:  Medications: Current Outpatient Medications  Medication Sig Dispense Refill  . Cholecalciferol (VITAMIN D PO) Take 6,000 Units by mouth daily.     . montelukast (SINGULAIR) 10 MG tablet Take 1 tablet (10 mg total) by  mouth at bedtime. 90 tablet 3   No current facility-administered medications for this visit.    Allergies  Allergen Reactions  . Effexor [Venlafaxine] Other (See Comments)    Serotonin Syndrome  . Azithromycin Rash  . Erythromycin Rash    Health Maintenance Health Maintenance  Topic Date Due  . PAP SMEAR  04/03/2015  . INFLUENZA VACCINE  11/05/2017 (Originally 08/15/2016)  . TETANUS/TDAP  03/06/2027  . HIV Screening  Completed     Exam:  BP 126/78   Pulse (!) 51   Ht 5' 5.5" (1.664 m)   Wt 156 lb (70.8 kg)   BMI 25.56 kg/m  Gen: Well NAD HEENT: EOMI,  MMM Lungs: Normal work of breathing. CTABL Heart: RRR no MRG Abd: NABS, Soft. Nondistended, Nontender Exts: Brisk capillary refill, warm and well perfused.  Dep[facility] screen [facility] 03/05/2017 10/03/2015  Decreased Interest 1 2  Down, Depressed, Hopeless 0 0  PHQ - 2 Score 1 2  Altered sleeping - 3  Tired, decreased energy - 3  Change in appetite - 3  Feeling bad or failure about yourself  - 1  Trouble concentrating - 2  Moving slowly or fidgety/restless - 1  Suicidal thoughts - 0  PHQ-9 Score - 15     No results found for this or any previous visit (from the past 72 hour(s)). No results found.  Assessment and Plan: 42 y.o. female here for annual exam. No acute health concerns at this point. Only on Singulair for  allergies. No longer on birth control. Was on it for mood. PHQ9 was 7 today with no impact on daily life. She feels like she is coping very well and is doing well overall.   TDap today. Declined flu vaccine. Will draw VitD and lipid panel due to elevated cholesterol on last labs.   Mammogram at 42yo later this year.   Cervical cancer screening done last year at [facility]. Will get records.   Orders Placed This Encounter  Procedures  . Tdap vaccine greater than or equal to 7yo IM  . VITAMIN D 25 Hydroxy (Vit-D Deficiency, Fractures)  . Lipid Panel w/reflex Direct LDL   No orders of the defined types  were placed in this encounter.  Discussed warning signs or symptoms. Please see discharge instructions. Patient expresses understanding.

## 2017-03-05 NOTE — Patient Instructions (Addendum)
Thank you for coming in today. Get fasting labs in the near future.  We will do Tdap today.  [facility]  Start mammogram when 42.  Continue exercise.

## 2017-03-09 LAB — VITAMIN D 25 HYDROXY (VIT D DEFICIENCY, FRACTURES): VIT D 25 HYDROXY: 72 ng/mL (ref 30–100)

## 2017-03-09 LAB — LIPID PANEL W/REFLEX DIRECT LDL
CHOL/HDL RATIO: 3.2 (calc) (ref <5.0)
Cholesterol: 273 mg/dL — ABNORMAL HIGH (ref <200)
HDL: 85 mg/dL (ref >50)
LDL Cholesterol (Calc): 169 mg/dL (calc) — ABNORMAL HIGH
Non-HDL Cholesterol (Calc): 188 mg/dL (calc) — ABNORMAL HIGH (ref <130)
TRIGLYCERIDES: 82 mg/dL (ref <150)

## 2017-03-14 ENCOUNTER — Encounter: Payer: Self-pay | Admitting: Family Medicine

## 2017-03-14 DIAGNOSIS — E7212 Methylenetetrahydrofolate reductase deficiency: Secondary | ICD-10-CM | POA: Insufficient documentation

## 2017-03-14 DIAGNOSIS — Z1589 Genetic susceptibility to other disease: Secondary | ICD-10-CM | POA: Insufficient documentation

## 2017-04-11 IMAGING — DX DG CHEST 2V
2 series · 2 of 2 positions shown · non-contrast
Comparison: None.

CLINICAL DATA: Fatigue for several months with abnormal EKG

EXAM:
CHEST  2 VIEW

[chest pa]
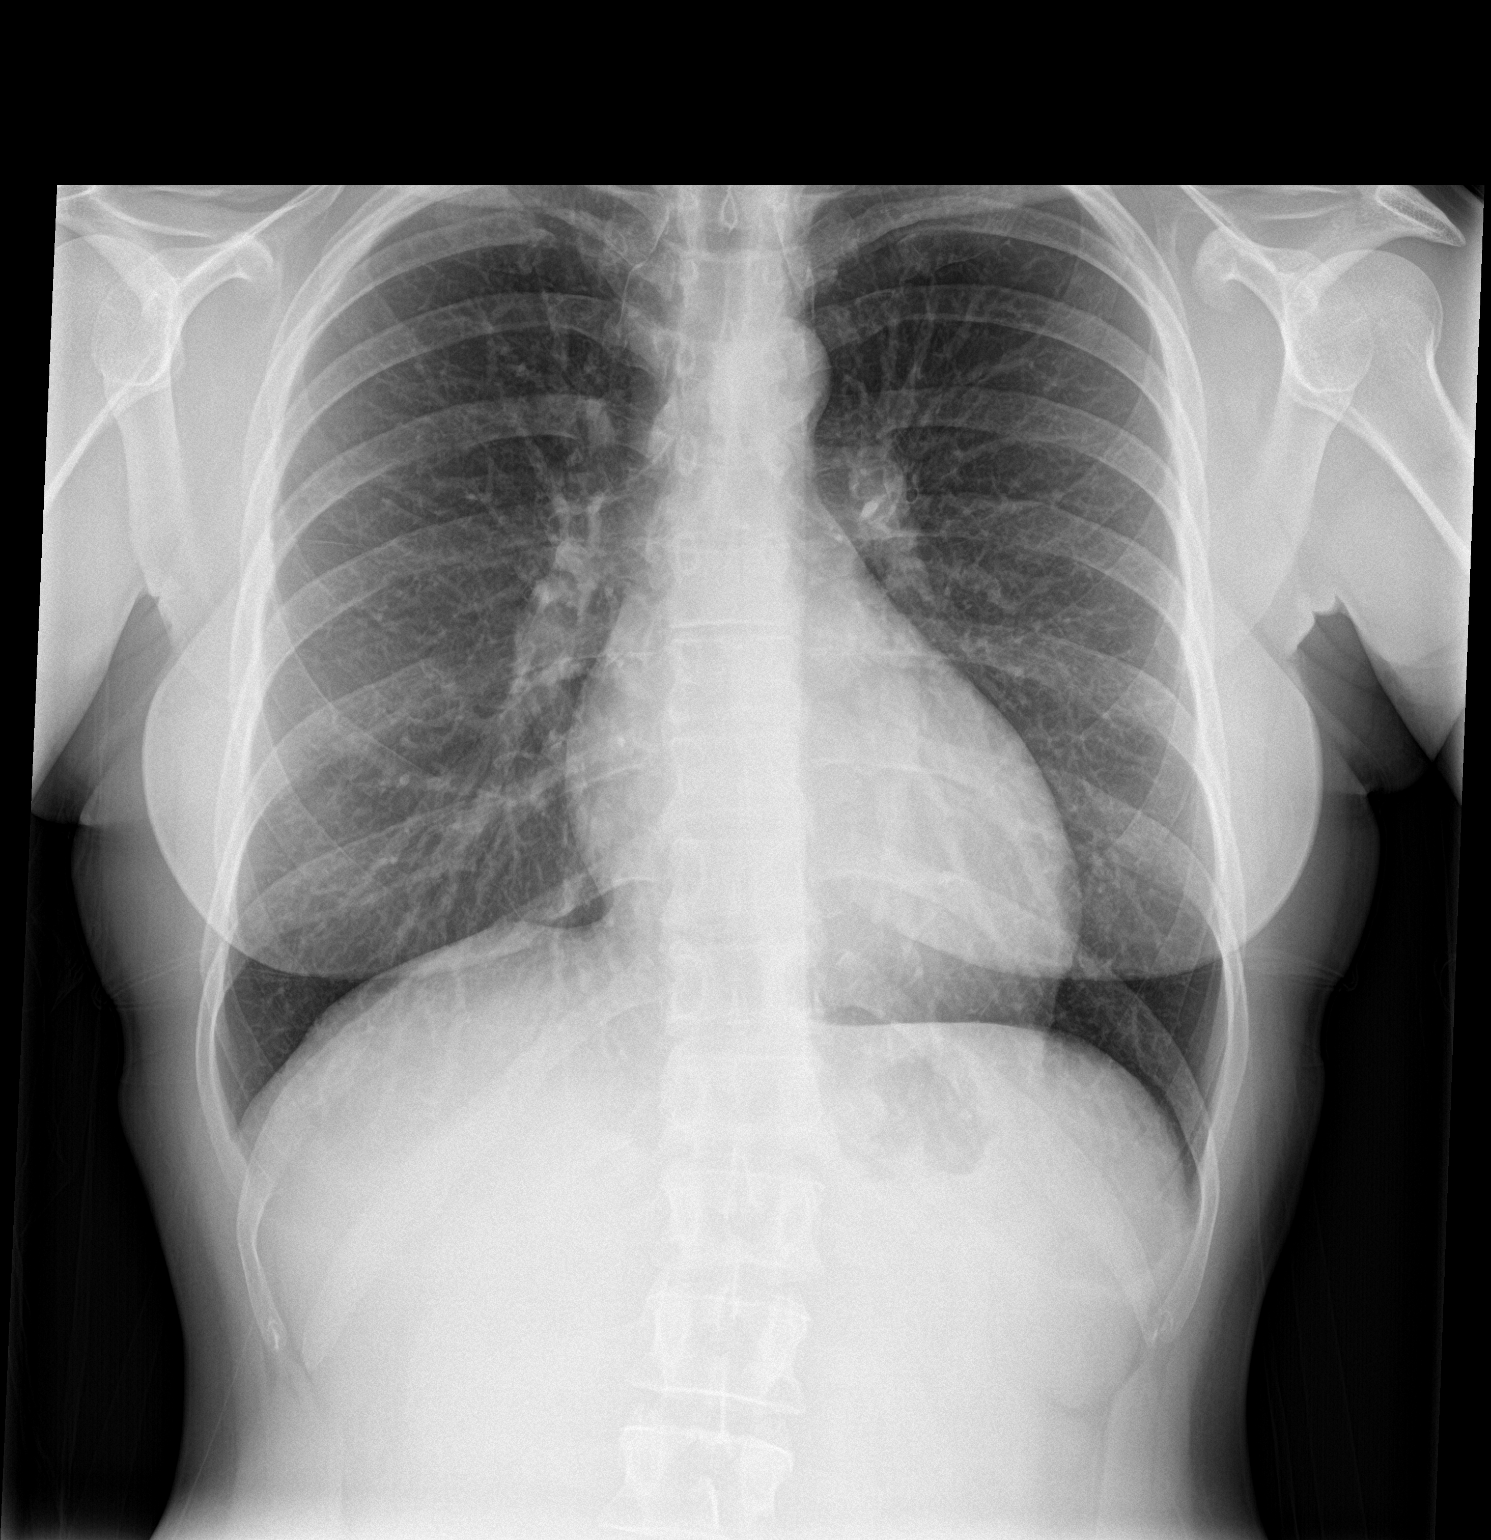

[chest lat]
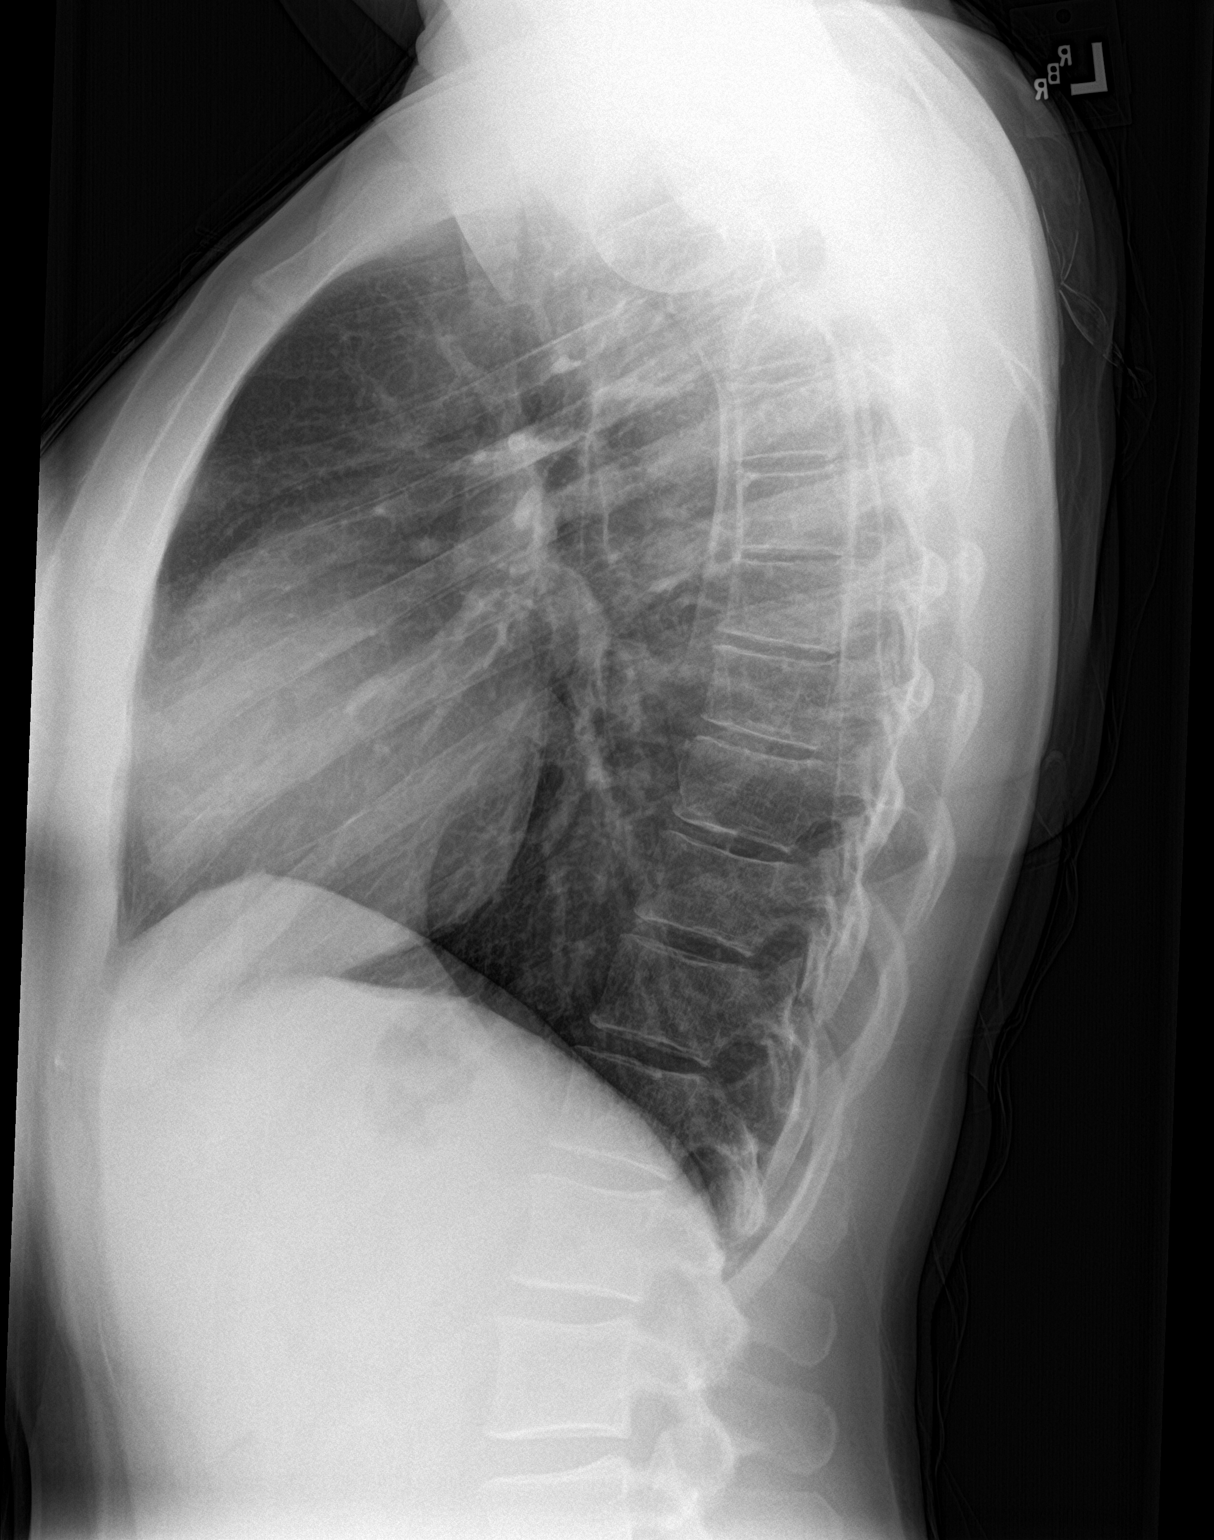

[2 of 2 positions shown; findings below may reference images not displayed]

FINDINGS: The heart size and mediastinal contours are within normal limits.
Both lungs are clear. The visualized skeletal structures are
unremarkable.
IMPRESSION: No active cardiopulmonary disease.

## 2017-04-18 ENCOUNTER — Ambulatory Visit: Payer: Managed Care, Other (non HMO) | Admitting: Sports Medicine

## 2017-04-18 ENCOUNTER — Encounter: Payer: Self-pay | Admitting: Sports Medicine

## 2017-04-18 DIAGNOSIS — J209 Acute bronchitis, unspecified: Secondary | ICD-10-CM | POA: Diagnosis not present

## 2017-04-18 DIAGNOSIS — K219 Gastro-esophageal reflux disease without esophagitis: Secondary | ICD-10-CM | POA: Diagnosis not present

## 2017-04-18 MED ORDER — PANTOPRAZOLE SODIUM 40 MG PO TBEC: 40.0000 mg | DELAYED_RELEASE_TABLET | Freq: Every day | ORAL | 3 refills | Status: DC

## 2017-04-18 MED ORDER — BENZONATATE 200 MG PO CAPS: 200.0000 mg | ORAL_CAPSULE | Freq: Three times a day (TID) | ORAL | 0 refills | Status: DC | PRN

## 2017-04-18 MED ORDER — HYDROCOD POLST-CPM POLST ER 10-8 MG/5ML PO SUER: 5.0000 mL | Freq: Two times a day (BID) | ORAL | 0 refills | Status: DC | PRN

## 2017-04-18 NOTE — Assessment & Plan Note (Signed)
Starting pantoprazole, she will take it at night, continue good sleep hygiene, no food consumption within 2 hours of bedtime. Return to see me in 4-6 weeks, we will consider ENT referral for repeat direct visualization if no better.

## 2017-04-18 NOTE — Progress Notes (Signed)
Subjective:    CC: 7 days of cough and sore throat; feels like throat is obstructed  HPI: Dana Gomez is a 41yo presenting today with a cc of 7 days of productive cough with yellow sputum.  A sore throat has accompanied this cough but pt denies fever, chills, nausea, and vomiting.  Pt repots unsuccessful use of alka-seltzer to alleviiate the symptoms.  Pt reports that otc mucinex has been helpful but cough has persisted.    Secondarily pt reports that a sputumless cough occurs after exercise and at the end of long expirations.  Pt endorses feeling a tickle in her throat that sometimes precedes the need to cough.  Pt endorses feelings of obstruction in her throat as well as perpetual hoarseness.      I reviewed the past medical history, family history, social history, surgical history, and allergies today and no changes were needed.  Please see the problem list section below in epic for further details.  Past Medical History: Past Medical History:  Diagnosis Date  . Asthma   . Hyperlipidemia   . Serotonin syndrome (History of) 08/05/2012   Past Surgical History: Past Surgical History:  Procedure Laterality Date  . NASAL SINUS SURGERY  1999  . schleral buckle  [id number],2005   Social History: Social History   Socioeconomic History  . Marital status: Married    Spouse name: Not on file  . Number of children: Not on file  . Years of education: Not on file  . Highest education level: Not on file  Occupational History  . Not on file  Social Needs  . Financial resource strain: Not on file  . Food insecurity:    Worry: Not on file    Inability: Not on file  . Transportation needs:    Medical: Not on file    Non-medical: Not on file  Tobacco Use  . Smoking status: Never Smoker  . Smokeless tobacco: Never Used  Substance and Sexual Activity  . Alcohol use: Yes    Alcohol/week: 1.2 oz    Types: 2 Glasses of wine per week    Comment: occ  . Drug use: No  . Sexual activity: Yes    Lifestyle  . Physical activity:    Days per week: Not on file    Minutes per session: Not on file  . Stress: Not on file  Relationships  . Social connections:    Talks on phone: Not on file    Gets together: Not on file    Attends religious service: Not on file    Active member of club or organization: Not on file    Attends meetings of clubs or organizations: Not on file    Relationship status: Not on file  Other Topics Concern  . Not on file  Social History Narrative  . Not on file   Family History: Family History  Problem Relation Age of Onset  . Breast cancer Maternal Grandmother   . Hyperlipidemia Mother   . Alcoholism Mother   . Hypertension Mother   . Alcoholism Father   . Adrenal disorder Neg Hx   . Other Neg Hx    Allergies: Allergies  Allergen Reactions  . Effexor [Venlafaxine] Other (See Comments)    Serotonin Syndrome  . Azithromycin Rash  . Erythromycin Rash   Medications: See med rec.  Review of Systems: No fevers, chills, night sweats, weight loss, chest pain, or shortness of breath.   Objective:    General: Well Developed, well  nourished, and in no acute distress.  Neuro: Alert and oriented x3, extra-ocular muscles intact, sensation grossly intact.  HEENT: Normocephalic, atraumatic, pupils equal round reactive to light, neck supple, no masses, left cervical lymphadenopathy, thyroid nonpalpable.  Skin: Warm and dry, no rashes. Cardiac: Regular rate and rhythm, no murmurs rubs or gallops, no lower extremity edema.  Respiratory: Clear to auscultation bilaterally. Not using accessory muscles, speaking in full sentences.   Impression and Recommendations:    LPRD (laryngopharyngeal reflux disease) Starting pantoprazole, she will take it at night, continue good sleep hygiene, no food consumption within 2 hours of bedtime. Return to see me in 4-6 weeks, we will consider ENT referral for repeat direct visualization if no better.  Acute  bronchitis Benign exam. Tussionex at bedtime, Tessalon Perles during the day, follow-up if no better in a couple of weeks.   ___________________________________________ Ihor Austin. Benjamin Stain, M.D., ABFM., CAQSM. Primary Care and Sports Medicine [facility]  Adjunct Instructor of Family Medicine  [employer]

## 2017-04-18 NOTE — Assessment & Plan Note (Signed)
Benign exam. Tussionex at bedtime, Tessalon Perles during the day, follow-up if no better in a couple of weeks.

## 2017-04-18 NOTE — Patient Instructions (Signed)
Food Choices for Gastroesophageal Reflux Disease, Adult When you have gastroesophageal reflux disease (GERD), the foods you eat and your eating habits are very important. Choosing the right foods can help ease the discomfort of GERD. Consider working with a diet and nutrition specialist (dietitian) to help you make healthy food choices. What general guidelines should I follow? Eating plan  Choose healthy foods low in fat, such as fruits, vegetables, whole grains, low-fat dairy products, and lean meat, fish, and poultry.  Eat frequent, small meals instead of three large meals each day. Eat your meals slowly, in a relaxed setting. Avoid bending over or lying down until 2-3 hours after eating.  Limit high-fat foods such as fatty meats or fried foods.  Limit your intake of oils, butter, and shortening to less than 8 teaspoons each day.  Avoid the following: ? Foods that cause symptoms. These may be different for different people. Keep a food diary to keep track of foods that cause symptoms. ? Alcohol. ? Drinking large amounts of liquid with meals. ? Eating meals during the 2-3 hours before bed.  Cook foods using methods other than frying. This may include baking, grilling, or broiling. Lifestyle   Maintain a healthy weight. Ask your health care provider what weight is healthy for you. If you need to lose weight, work with your health care provider to do so safely.  Exercise for at least 30 minutes on 5 or more days each week, or as told by your health care provider.  Avoid wearing clothes that fit tightly around your waist and chest.  Do not use any products that contain nicotine or tobacco, such as cigarettes and e-cigarettes. If you need help quitting, ask your health care provider.  Sleep with the head of your bed raised. Use a wedge under the mattress or blocks under the bed frame to raise the head of the bed. What foods are not recommended? The items listed may not be a complete  list. Talk with your dietitian about what dietary choices are best for you. Grains Pastries or quick breads with added fat. French toast. Vegetables Deep fried vegetables. French fries. Any vegetables prepared with added fat. Any vegetables that cause symptoms. For some people this may include tomatoes and tomato products, chili peppers, onions and garlic, and horseradish. Fruits Any fruits prepared with added fat. Any fruits that cause symptoms. For some people this may include citrus fruits, such as oranges, grapefruit, pineapple, and lemons. Meats and other protein foods High-fat meats, such as fatty beef or pork, hot dogs, ribs, ham, sausage, salami and bacon. Fried meat or protein, including fried fish and fried chicken. Nuts and nut butters. Dairy Whole milk and chocolate milk. Sour cream. Cream. Ice cream. Cream cheese. Milk shakes. Beverages Coffee and tea, with or without caffeine. Carbonated beverages. Sodas. Energy drinks. Fruit juice made with acidic fruits (such as orange or grapefruit). Tomato juice. Alcoholic drinks. Fats and oils Butter. Margarine. Shortening. Ghee. Sweets and desserts Chocolate and cocoa. Donuts. Seasoning and other foods Pepper. Peppermint and spearmint. Any condiments, herbs, or seasonings that cause symptoms. For some people, this may include curry, hot sauce, or vinegar-based salad dressings. Summary  When you have gastroesophageal reflux disease (GERD), food and lifestyle choices are very important to help ease the discomfort of GERD.  Eat frequent, small meals instead of three large meals each day. Eat your meals slowly, in a relaxed setting. Avoid bending over or lying down until 2-3 hours after eating.  Limit high-fat   foods such as fatty meat or fried foods. This information is not intended to replace advice given to you by your health care provider. Make sure you discuss any questions you have with your health care provider. Document Released:  01/01/2005 Document Revised: 01/03/2016 Document Reviewed: 01/03/2016 Elsevier Interactive Patient Education  2018 Elsevier Inc.  

## 2017-07-11 ENCOUNTER — Encounter: Payer: Self-pay | Admitting: Sports Medicine

## 2017-07-29 ENCOUNTER — Other Ambulatory Visit: Payer: Self-pay | Admitting: Family Medicine

## 2017-07-29 DIAGNOSIS — H6982 Other specified disorders of Eustachian tube, left ear: Secondary | ICD-10-CM

## 2017-08-12 ENCOUNTER — Other Ambulatory Visit: Payer: Self-pay | Admitting: Sports Medicine

## 2017-08-12 DIAGNOSIS — K219 Gastro-esophageal reflux disease without esophagitis: Secondary | ICD-10-CM

## 2017-08-23 ENCOUNTER — Encounter: Payer: Self-pay | Admitting: Sports Medicine

## 2017-08-28 ENCOUNTER — Encounter: Payer: Self-pay | Admitting: Sports Medicine

## 2017-08-28 ENCOUNTER — Ambulatory Visit: Payer: Self-pay

## 2017-08-28 ENCOUNTER — Ambulatory Visit: Payer: Managed Care, Other (non HMO) | Admitting: Sports Medicine

## 2017-08-28 ENCOUNTER — Ambulatory Visit (INDEPENDENT_AMBULATORY_CARE_PROVIDER_SITE_OTHER): Payer: Managed Care, Other (non HMO) | Admitting: Sports Medicine

## 2017-08-28 VITALS — BP 116/76 | HR 54 | Ht 65.5 in | Wt 154.8 lb

## 2017-08-28 DIAGNOSIS — M25551 Pain in right hip: Secondary | ICD-10-CM | POA: Diagnosis not present
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
## 2017-08-28 NOTE — Procedures (Signed)
PROCEDURE NOTE:  Ultrasound Guided: Injection: Right hip Images were obtained and interpreted by myself, Gaspar BiddingMichael Levent Kornegay, DO  Images have been saved and stored to PACS system. Images obtained on: GE S7 Ultrasound machine    ULTRASOUND FINDINGS:  Small joint effusion but minimal.  Thickening of the labrum without overt tearing.  DESCRIPTION OF PROCEDURE:  The patient's clinical condition is marked by substantial pain and/or significant functional disability. Other conservative therapy has not provided relief, is contraindicated, or not appropriate. There is a reasonable likelihood that injection will significantly improve the patient's pain and/or functional impairment.   After discussing the risks, benefits and expected outcomes of the injection and all questions were reviewed and answered, the patient wished to undergo the above named procedure.  Verbal consent was obtained.  The ultrasound was used to identify the target structure and adjacent neurovascular structures. The skin was then prepped in sterile fashion and the target structure was injected under direct visualization using sterile technique as below:  Single injection performed as below: PREP: Alcohol and Ethel Chloride APPROACH:direct, stopcock technique, 22g 3.5 in. INJECTATE: 5 cc 1% lidocaine, 2 cc 0.5% Marcaine and 2 cc 40mg /mL DepoMedrol ASPIRATE: None DRESSING: Band-Aid  Post procedural instructions including recommending icing and warning signs for infection were reviewed.    This procedure was well tolerated and there were no complications.   IMPRESSION: Succesful Ultrasound Guided: Injection

## 2017-08-28 NOTE — Patient Instructions (Signed)

## 2017-09-20 ENCOUNTER — Telehealth: Payer: Self-pay | Admitting: Family Medicine

## 2017-09-20 NOTE — Telephone Encounter (Signed)
I see that you have seen 1 of my colleagues Dr. Berline Chough for hip pain.  You may be aware but I am also a sports medicine doctor.  I am happy to see you for your hip pain if needed.

## 2017-09-20 NOTE — Telephone Encounter (Signed)
Pt has reviewed note from Dr Denyse Amass on [facility]

## 2017-09-26 ENCOUNTER — Encounter: Payer: Self-pay | Admitting: Family Medicine

## 2017-09-26 ENCOUNTER — Ambulatory Visit (INDEPENDENT_AMBULATORY_CARE_PROVIDER_SITE_OTHER): Payer: Managed Care, Other (non HMO) | Admitting: Family Medicine

## 2017-09-26 VITALS — BP 126/82 | HR 57 | Ht 65.5 in | Wt 158.0 lb

## 2017-09-26 DIAGNOSIS — M25551 Pain in right hip: Secondary | ICD-10-CM | POA: Diagnosis not present

## 2017-09-26 DIAGNOSIS — F419 Anxiety disorder, unspecified: Secondary | ICD-10-CM

## 2017-09-26 DIAGNOSIS — M76891 Other specified enthesopathies of right lower limb, excluding foot: Secondary | ICD-10-CM | POA: Diagnosis not present

## 2017-09-26 DIAGNOSIS — G2579 Other drug induced movement disorders: Secondary | ICD-10-CM | POA: Diagnosis not present

## 2017-09-26 DIAGNOSIS — F330 Major depressive disorder, recurrent, mild: Secondary | ICD-10-CM

## 2017-09-26 MED ORDER — HYDROCODONE-ACETAMINOPHEN 5-325 MG PO TABS: 1.0000 | ORAL_TABLET | Freq: Four times a day (QID) | ORAL | 0 refills | Status: DC | PRN

## 2017-09-26 NOTE — Patient Instructions (Signed)
Thank you for coming in today. Attend PT.  Do the side leg raises.  Do the [place] Do the cross over stretch and the standing IT band stretch.  Do about 30 reps 2-3x daily.   Recheck in 4 weeks.

## 2017-09-27 ENCOUNTER — Encounter: Payer: Self-pay | Admitting: Family Medicine

## 2017-09-27 DIAGNOSIS — F419 Anxiety disorder, unspecified: Secondary | ICD-10-CM | POA: Insufficient documentation

## 2017-09-27 DIAGNOSIS — F329 Major depressive disorder, single episode, unspecified: Secondary | ICD-10-CM

## 2017-09-27 DIAGNOSIS — M76891 Other specified enthesopathies of right lower limb, excluding foot: Secondary | ICD-10-CM | POA: Insufficient documentation

## 2017-09-27 HISTORY — DX: Major depressive disorder, single episode, unspecified: F32.9

## 2017-09-27 NOTE — Progress Notes (Signed)
Dana Gomez is a 42 y.o. female [facility] today for right hip pain.  Dana Gomez notes a several month history of pain in the right hip.  She notes the pain is predominantly in the lateral hip however she does have some pain in the anterior hip/groin.  She notes this is been slowly and gradually worsening over the past about 4 months.  She cannot recall any specific injury.  She notes it is getting bad enough that she is having trouble completing her exercises.  She typically will run and participate in CrossFit.  Over the last 6 weeks she is been unable to exercise and is very bothersome to her.  She notes pain is located in the lateral hip and can be quite severe.  Pain is worse with activity and better with rest however she does have pain when laying on her right side.  She is been receiving care via her chiropractor.  Additionally she was referred to Dr. Berline Choughigby who performed an ultrasound-guided intra-articular hip injection.  She notes that immediately after the injection when the hip joint was numb she did not have much benefit or reduction in her pain.  She notes the steroid component has not helped much either without much change in her hip pain.  She has not been doing exercises dedicated at hip abductor strengthening.   She notes that her stress level and depression symptoms have been worsened recently.  She typically uses exercises as a way of helping to manage her depression and anxiety symptoms.  She notes that with reduced exercise she is been having worsening mood symptoms.  She denies any SI or HI.  Patient notes in the past she had serotonin syndrome with Effexor.   ROS as above:  Exam:  BP 126/82   Pulse (!) 57   Ht 5' 5.5" (1.664 m)   Wt 158 lb (71.7 kg)   BMI 25.89 kg/m  Wt Readings from Last 5 Encounters:  09/26/17 158 lb (71.7 kg)  08/28/17 154  lb 12.8 oz (70.2 kg)  04/18/17 153 lb (69.4 kg)  03/05/17 156 lb (70.8 kg)  08/28/16 155 lb (70.3 kg)    Gen: Well NAD HEENT: EOMI,  MMM Lungs: Normal work of breathing. CTABL Heart: RRR no MRG Abd: NABS, Soft. Nondistended, Nontender Exts: Brisk capillary refill, warm and well perfused.  L-spine: Nontender to spinal midline normal back motion. Right hip: Normal-appearing tender to palpation at the lateral hip near the insertion of the gluteus medius tendon. Hip motion is normal throughout. Positive right-sided FABER test. Negative FADIR test Hip abduction strength significantly diminished 4/5  Left hip normal-appearing nontender normal motion.  Hip abduction strength 4+/5  Antalgic gait present  Psych alert and oriented normal speech and thought process.  Affect is tearful.  No SI or HI expressed.  Depression screen [facility] 2/[facility] 09/27/2017 03/05/2017 10/03/2015  Decreased Interest 1 1 2   Down, Depressed, Hopeless 1 0 0  PHQ - 2 Score 2 1 2   Altered sleeping 2 - 3  Tired, decreased energy 2 - 3  Change in appetite 3 - 3  Feeling bad or failure about yourself  1 - 1  Trouble concentrating 2 - 2  Moving slowly or fidgety/restless 0 - 1  Suicidal thoughts 0 - 0  PHQ-9 Score 12 - 15  Difficult doing work/chores Somewhat difficult - -   GAD 7 : Generalized Anxiety Score 09/27/2017  Nervous, Anxious, on Edge 2  Control/stop worrying 2  Worry too much - different things 2  Trouble relaxing 1  Restless 1  Easily annoyed or irritable 2  Afraid - awful might happen 0  Total GAD 7 Score 10  Anxiety Difficulty Somewhat difficult       Lab and Radiology Results X-ray images right hip dated August 28, 2017 personally independently reviewed.  Small loose body present in the superior portion of the femoral acetabular joint on 2 view hip x-rays.  No acute fractures present.    Assessment and Plan: 42 y.o. female with  Right hip pain: Patient likely has multifactorial pain.  The  majority of her pain seems to be in the hip abductor compartment very likely gluteus medius tendon insertion.  She is certainly weakness area and is quite tender and painful.  Additionally she does have some groin symptoms and does have on x-ray a what appears to be a loose body in her hip joint.  However when she had a intra-articular injection that included lidocaine she did not experience much benefit therefore I do believe the majority of her pain generator is in the lateral hip and not in the femoral acetabular joint.  Plan to proceed with home exercise program and formal physical therapy.  Limited hydrocodone for pain control.   Mood: Depression and anxiety symptoms.  Patient lost her main treatment for depression and anxiety which is her ability to exercise.  We discussed some strategies to allow her to continue exercise while rehabbing her hip.  Additionally we discussed potential other treatments including medications and counseling.  Watchful waiting with transitioning to different forms of exercise however medications may be helpful in the future.  Patient did have a history of serotonin syndrome with Effexor therefore medication use must be very careful.  Recheck 4 weeks.   Orders Placed This Encounter  Procedures  . Ambulatory referral to Physical Therapy    Referral Priority:   Routine    Referral Type:   Physical Medicine    Referral Reason:   Specialty Services Required    Requested Specialty:   Physical Therapy   Meds ordered this encounter  Medications  . HYDROcodone-acetaminophen (NORCO/VICODIN) 5-325 MG tablet    Sig: Take 1 tablet by mouth every 6 (six) hours as needed.    Dispense:  15 tablet    Refill:  0     Historical information moved to improve visibility of documentation.  Past Medical History:  Diagnosis Date  . Asthma   . Hyperlipidemia   . MDD (major depressive disorder) 09/27/2017  . Serotonin syndrome (History of) 08/05/2012   Past Surgical History:    Procedure Laterality Date  . NASAL SINUS SURGERY  1999  . schleral buckle  [id number]   Social History   Tobacco Use  . Smoking status: Never Smoker  . Smokeless tobacco: Never Used  Substance Use Topics  . Alcohol use: Yes    Alcohol/week: 2.0 standard drinks    Types: 2 Glasses of wine per week    Comment: occ   family history includes Alcoholism in her father and mother; Breast cancer in her maternal grandmother; Hyperlipidemia in her mother; Hypertension in her mother.  Medications: Current Outpatient Medications  Medication Sig Dispense Refill  . Cholecalciferol (VITAMIN D PO) Take 6,000 Units by mouth daily.     . montelukast (SINGULAIR) 10 MG tablet TAKE 1 TABLET BY MOUTH EVERYDAY AT BEDTIME 90 tablet 3  . HYDROcodone-acetaminophen (NORCO/VICODIN) 5-325 MG  tablet Take 1 tablet by mouth every 6 (six) hours as needed. 15 tablet 0   No current facility-administered medications for this visit.    Allergies  Allergen Reactions  . Effexor [Venlafaxine] Other (See Comments)    Serotonin Syndrome  . Azithromycin Rash  . Erythromycin Rash     Discussed warning signs or symptoms. Please see discharge instructions. Patient expresses understanding.

## 2017-09-30 ENCOUNTER — Ambulatory Visit (INDEPENDENT_AMBULATORY_CARE_PROVIDER_SITE_OTHER): Payer: Managed Care, Other (non HMO) | Admitting: Rehabilitative and Restorative Service Providers"

## 2017-09-30 ENCOUNTER — Encounter: Payer: Self-pay | Admitting: Rehabilitative and Restorative Service Providers"

## 2017-09-30 DIAGNOSIS — R29898 Other symptoms and signs involving the musculoskeletal system: Secondary | ICD-10-CM | POA: Diagnosis not present

## 2017-09-30 DIAGNOSIS — M25551 Pain in right hip: Secondary | ICD-10-CM | POA: Diagnosis not present

## 2017-09-30 NOTE — Patient Instructions (Signed)
Myofacial ball release work hip flexors; gluts/piriformis   Piriformis Stretch   Lying on back, pull right knee toward opposite shoulder. Hold 30 seconds. Repeat 3 times. Do 2-3 sessions per day.   Quads / HF, Supine   Lie near edge of bed, pull both knees up toward chest. Hold one knee as you drop the other leg off the edge of the bed.  Relax hanging knee/can bend knee back if indicated. Hold 30 seconds. Repeat 3 times per session. Do 2-3 sessions per day.    Abdominal Bracing With Pelvic Floor (Hook-Lying)    With neutral spine, tighten pelvic floor and abdominals sucking belly botton to back bone; tighten muscles in the low back. Hold 10 sec  Repeat 10___ times. Do _several __ times a day. Progress to sitting; standing; and with functional activities    Sleeping on Back  Place pillow under knees. A pillow with cervical support and a roll around waist are also helpful. Copyright  VHI. All rights reserved.  Sleeping on Side Place pillow between knees. Use cervical support under neck and a roll around waist as needed. Copyright  VHI. All rights reserved.   Sleeping on Stomach   If this is the only desirable sleeping position, place pillow under lower legs, and under stomach or chest as needed.  Posture - Sitting   Sit upright, head facing forward. Try using a roll to support lower back. Keep shoulders relaxed, and avoid rounded back. Keep hips level with knees. Avoid crossing legs for long periods. Stand to Sit / Sit to Stand   To sit: Bend knees to lower self onto front edge of chair, then scoot back on seat. To stand: Reverse sequence by placing one foot forward, and scoot to front of seat. Use rocking motion to stand up.   Work Height and Reach  Ideal work height is no more than 2 to 4 inches below elbow level when standing, and at elbow level when sitting. Reaching should be limited to arm's length, with elbows slightly bent.  Bending  Bend at hips and  knees, not back. Keep feet shoulder-width apart.    Posture - Standing   Good posture is important. Avoid slouching and forward head thrust. Maintain curve in low back and align ears over shoul- ders, hips over ankles.  Alternating Positions   Alternate tasks and change positions frequently to reduce fatigue and muscle tension. Take rest breaks. Computer Work   Position work to Art gallery manager. Use proper work and seat height. Keep shoulders back and down, wrists straight, and elbows at right angles. Use chair that provides full back support. Add footrest and lumbar roll as needed.  Getting Into / Out of Car  Lower self onto seat, scoot back, then bring in one leg at a time. Reverse sequence to get out.  Dressing  Lie on back to pull socks or slacks over feet, or sit and bend leg while keeping back straight.    Housework - Sink  Place one foot on ledge of cabinet under sink when standing at sink for prolonged periods.   Pushing / Pulling  Pushing is preferable to pulling. Keep back in proper alignment, and use leg muscles to do the work.  Deep Squat   Squat and lift with both arms held against upper trunk. Tighten stomach muscles without holding breath. Use smooth movements to avoid jerking.  Avoid Twisting   Avoid twisting or bending back. Pivot around using foot movements, and bend at knees if  needed when reaching for articles.  Carrying Luggage   Distribute weight evenly on both sides. Use a cart whenever possible. Do not twist trunk. Move body as a unit.   Lifting Principles .Maintain proper posture and head alignment. .Slide object as close as possible before lifting. .Move obstacles out of the way. .Test before lifting; ask for help if too heavy. .Tighten stomach muscles without holding breath. .Use smooth movements; do not jerk. .Use legs to do the work, and pivot with feet. .Distribute the work load symmetrically and close to the center of trunk. .Push  instead of pull whenever possible.   Ask For Help   Ask for help and delegate to others when possible. Coordinate your movements when lifting together, and maintain the low back curve.  Log Roll   Lying on back, bend left knee and place left arm across chest. Roll all in one movement to the right. Reverse to roll to the left. Always move as one unit. Housework - Sweeping  Use long-handled equipment to avoid stooping.   Housework - Wiping  Position yourself as close as possible to reach work surface. Avoid straining your back.  Laundry - Unloading Wash   To unload small items at bottom of washer, lift leg opposite to arm being used to reach.  Gardening - Raking  Move close to area to be raked. Use arm movements to do the work. Keep back straight and avoid twisting.     Cart  When reaching into cart with one arm, lift opposite leg to keep back straight.   Getting Into / Out of Bed  Lower self to lie down on one side by raising legs and lowering head at the same time. Use arms to assist moving without twisting. Bend both knees to roll onto back if desired. To sit up, start from lying on side, and use same move-ments in reverse. Housework - Vacuuming  Hold the vacuum with arm held at side. Step back and forth to move it, keeping head up. Avoid twisting.   Laundry - Armed forces training and education officerLoading Wash  Position laundry basket so that bending and twisting can be avoided.   Laundry - Unloading Dryer  Squat down to reach into clothes dryer or use a reacher.  Gardening - Weeding / Psychiatric nurselanting  Squat or Kneel. Knee pads may be helpful.

## 2017-09-30 NOTE — Therapy (Signed)
[facility] [place] [address] [place], [place], [address] Phone: [phone]   Fax:  [phone]  Physical Therapy Evaluation  Patient Details  Name: Dana Gomez MRN: [id number] Date of Birth: 02/07/1975 Referring Provider: Dr Clementeen Graham    Encounter Date: 09/30/2017  PT End of Session - 09/30/17 1718    Visit Number  1    Number of Visits  12    Date for PT Re-Evaluation  11/11/17    PT Start Time  1521    PT Stop Time  1622    PT Time Calculation (min)  61 min    Activity Tolerance  Patient tolerated treatment well       Past Medical History:  Diagnosis Date  . Asthma   . Hyperlipidemia   . MDD (major depressive disorder) 09/27/2017  . 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 NASAL SINUS SURGERY  1999  . schleral buckle  2003,2005    There were no vitals filed for this visit.   Subjective Assessment - 09/30/17 1532    Subjective  Patient reports that she began having pain in the Rt hip ~ 6 months ago with gradual onset of Rt hip pain which has increased in the past 6-7 wks. No known injury. 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  Yes    Pain Score  3     Pain Location  Hip    Pain Orientation  Right    Pain Descriptors / Indicators  Aching    Pain Type  Chronic pain    Pain Radiating Towards  sharp pain upper glut/low back QL; weakness and tightness in the hip flexors/groin - symptoms are bilat Rt >>>>LT - sharp pain in the Rt only     Pain Onset  More than a month ago    Pain Frequency  Constant   ache    Aggravating Factors   sitting; most with sit to stand; walking over 1 mile; stairs; hardwood floors      Pain Relieving Factors  sleep - better in the morning          [facility] PT Assessment - 09/30/17 0001      Assessment   Medical Diagnosis  Rt hip pain     Referring Provider  Dr Clementeen Graham     Onset Date/Surgical Date  03/15/17    Hand Dominance  Right    Next MD Visit  PRN     Prior Therapy  none       Precautions   Precautions  None      Balance Screen   Has the patient fallen in the past 6 months  No    Has the patient had a decrease in activity level because of a fear of falling?   No    Is the patient reluctant to leave their home because of a fear of falling?   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     Leisure  working out - running from a few miles to 10 miles 2-3 times/wk (usually 4-5 miles; cross fit 2-3 times/wk       Observation/Other Assessments   Focus on Therapeutic Outcomes (FOTO)   45 % limitation       Sensation   Additional Comments  WFL's per pt report       Posture/Postural Control   Posture Comments  sits slumped into chair - on the sacrum with decreased lumbar lordosis       AROM   Lumbar Flexion  90%    Lumbar Extension  85%    Lumbar - Right Side Bend  75%    Lumbar - Left Side Bend  75%    Lumbar - Right Rotation  40%    Lumbar - Left Rotation  40%       Strength   Overall Strength Comments  5/5 bilat LE's       Flexibility   Hamstrings  tight Rt > Lt     Quadriceps  tight Rt > Lt     Piriformis  tight Rt > Lt       Palpation   Spinal mobility  tender with CPA L4/5     Palpation comment  muscular tightness Rt QL; piriformis; glut med; psoas; hip flexors                  Objective measurements completed on examination: See above findings.      [facility] Adult PT Treatment/Exercise - 09/30/17 0001      Therapeutic Activites    Therapeutic Activities  --   myofacial ball release Rt hip flexors       Lumbar Exercises: Stretches   Press Ups  --   10 reps 1-2 sec hold to pt tolerance no pain    Press Ups Limitations  limited mobility with prone press up       Knee/Hip Exercises: Stretches   Hip Flexor Stretch  Right;Left;2 reps;30 seconds   supine both hips/knees into flexion then extending hip    Hip Flexor Stretch Limitations  hip flexor stretch seated 30 sec x 2 reps each     Piriformis Stretch  Right;Left;2 reps;30 seconds   supine travell modified to knee toward opposite shoulder      Moist Heat Therapy   Number Minutes Moist Heat  20 Minutes    Moist Heat Location  Hip;Lumbar Spine   anterior and posterior hip      Electrical Stimulation   Electrical Stimulation Location  Rt posterior hip distal QL to piriformis to posterior greater trochanter     Electrical Stimulation Action  IFC    Electrical Stimulation Parameters  to tolerance     Electrical Stimulation Goals  Pain;Tone             PT Education - 09/30/17 1611    Education Details  HEP     Person(s) Educated  Patient    Methods  Explanation;Demonstration;Tactile cues;Verbal cues;Handout    Comprehension  Verbalized understanding;Returned demonstration;Verbal cues required;Tactile cues required          PT Long Term Goals - 09/30/17 1727      PT LONG TERM GOAL #1   Title  Decrease pain by 50-75% allowing patient to resume more normal functional activities with less pain and postural changes 11/11/17    Time  6    Period  Weeks    Status  New      PT LONG TERM GOAL #2   Title  improve tissue extensibility with decreased muscular tightness to palpation through the posterior and anterior Rt hip 11/11/17    Time  6    Period  Weeks    Status  New      PT LONG TERM GOAL #3   Title  Return patient to more normal exercise program safely with minimal Rt hip pain and limitations 11/11/17    Time  6    Period  Weeks    Status  New      PT LONG TERM GOAL #4   Title  Independent in HEP 11/11/17     Time   6    Period  Weeks    Status  New      PT LONG TERM GOAL #5   Title  Improve FOTO to </= 31% limitation 11/11/17    Time  6    Period  Weeks    Status  New             Plan - 09/30/17 1719    Clinical Impression Statement  Deshanna presents with chronic Rt hip pain for the past 6+ months with pain through the posterior Rt hip and into the Rt groin. Symtpoms have progressively worsened in the past 2-3 months with patient reporting severe pain in the posterior hip which is worse with sitting and especially with sit to stand. She has poor posture and alignment; significant muscular tightness through the Rt hip flexors anteriorly into the quads as well as into the Rt posterior hip through the piriformis and gluts into the posterior greater trochanter. Patient is an avid exerciser running and doing cross fit workouts almost daily. She has limited her exercise in the past two months due to Rt hip pain. Khara has a history of buldging disc at L4/5 diagnosed in 2017. She will benefit from PT to address problems identified and gradually return patient o exercise and normal functional activities.     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 2017 managed conservatively; injection Rt hip and medication with minimal to no improvement     Clinical Presentation  Evolving    Clinical Decision Making  Low    Rehab Potential  Good    PT Frequency  2x / week    PT Duration  6 weeks    PT Treatment/Interventions  Patient/family education;ADLs/Self Care Home Management;Cryotherapy;Electrical Stimulation;Iontophoresis 4mg /ml Dexamethasone;Moist Heat;Ultrasound;Dry needling;Manual techniques;Neuromuscular re-education;Therapeutic activities;Therapeutic exercise    PT Next Visit Plan  review HEP; core stabilization; add deep tissue work through the psoas and hip flexors/quads and posteriorly to the gluts and piriformis; add prone quad stretch to address quad tightness to get to the deep hip  flexors; add myofacial ball release posterior hip; continue with modalities as indicated (pt is not interested in DN - does not like needles)     Consulted and Agree with Plan of Care  Patient       Patient will benefit from skilled therapeutic intervention in order to improve the following deficits and impairments:  Postural dysfunction, Improper body mechanics, Pain, Increased fascial restricitons, Increased muscle spasms, Decreased mobility, Decreased range of motion, Decreased activity tolerance  Visit Diagnosis: Pain in right hip - Plan: PT plan of care cert/re-cert  Other symptoms and signs involving the musculoskeletal system - Plan: PT plan of care cert/re-cert     Problem List Patient Active Problem List  Diagnosis Date Noted  . Hip abductor tendinitis, right 09/27/2017  . MDD (major depressive disorder) 09/27/2017  . Anxiety 09/27/2017  . Acute bronchitis 04/18/2017  . 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 MTHFR mutation (HCC) 03/14/2017  . 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 HLD (hyperlipidemia) 03/05/2017  . Lumbar herniated disc 08/28/2016  . Eustachian tube dysfunction, left 07/16/2016  . Pulmonary HTN (HCC) 10/14/2015  . Elevated cortisol level (HCC) 04/25/2015  . Otitis externa, mycotic 01/19/2015  . LPRD (laryngopharyngeal reflux disease) 10/06/2014  . 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 Vitamin D deficiency 08/22/2012  . Breast mass, right 08/05/2012  . Serotonin syndrome (History of) 08/05/2012    Mitsy Owen Rober Minion PT, MPH  09/30/2017, 5:34 PM  [facility] [address] [place], [place], [address] Phone: [phone]   Fax:  [phone]  Name: Shawntay Prest MRN: [id number] Date of Birth: 1975-04-22

## 2017-10-04 ENCOUNTER — Ambulatory Visit: Payer: Managed Care, Other (non HMO) | Admitting: Physical Therapy

## 2017-10-04 ENCOUNTER — Encounter: Payer: Self-pay | Admitting: Physical Therapy

## 2017-10-04 DIAGNOSIS — R29898 Other symptoms and signs involving the musculoskeletal system: Secondary | ICD-10-CM

## 2017-10-04 DIAGNOSIS — M25551 Pain in right hip: Secondary | ICD-10-CM

## 2017-10-04 NOTE — Therapy (Signed)
[place]  [place], [place], [address] Phone: [phone]   Fax:  [phone]  Physical Therapy Treatment  Patient Details  Name: Dana Gomez MRN: [id number] Date of Birth: 07-08-1975 Referring Provider: Dr. Lynne Leader   Encounter Date: 10/04/2017  PT End of Session - 10/04/17 1132    Visit Number  2    Number of Visits  12    Date for PT Re-Evaluation  11/11/17    PT Start Time  1022    PT Stop Time  1128    PT Time Calculation (min)  66 min       Past Medical History:  Diagnosis Date  . Asthma   . Hyperlipidemia   . MDD (major depressive disorder) 09/27/2017  . 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 NASAL SINUS SURGERY  1999  . schleral buckle  2003,2005    There were no vitals filed for this visit.  Subjective Assessment - 10/04/17 1024    Subjective  Pt reports she has been doing side leg lifts with green band 15 rep/ 2 sets 1x/day and this has been helping ease some of her symptoms.  She notices she can tolerate standing better after completing these.  She has done the stretches and ball release without much relief.      Patient Stated Goals  get rid of the hip pain and continue to exercise     Currently in Pain?  Yes    Pain Score  4     Pain Location  Hip    Pain Orientation  Right;Posterior;Anterior;Lateral    Pain Descriptors / Indicators  Aching         [facility] PT Assessment - 10/04/17 0001      Assessment   Medical Diagnosis  Rt hip pain     Referring Provider  Dr. Lynne Leader    Onset Date/Surgical Date  03/15/17    Hand Dominance  Right    Next MD Visit  PRN     Prior Therapy  none       Palpation   SI assessment   ilium level; Rt ASIS lower than Lt; Rt sacral torsion; Rt PSIS higher than Lt.     Palpation comment  pt point tender in Rt QL, Rt ASIS and iliopsoas         [facility] Adult PT Treatment/Exercise -  10/04/17 0001      Self-Care   Self-Care  Other Self-Care Comments    Other Self-Care Comments   Pt educated on how to perform self MET for Rt ant rotated hip. Pt returned demo with cues and verbalized understanding,.  Pt also educated how to palpate ASIS to assess if pelvis is level.       Exercises   Exercises  Lumbar      Lumbar Exercises: Stretches   Piriformis Stretch  --   verbally reviewed     Lumbar Exercises: Aerobic   Tread Mill  backwards walking @ 1.0 x 4 min (to elongate hip flexors)      Lumbar Exercises: Supine   Bridge  10 reps;5 seconds   with ball squeeze after MET corrections     Knee/Hip Exercises: Stretches   Hip Flexor Stretch  Right;30 seconds;4 reps   seated x 2, supine x 2     Moist Heat Therapy   Number Minutes Moist Heat  20 Minutes    Moist  Heat Location  Hip;Lumbar Spine   anterior and posterior hip      Electrical Stimulation   Electrical Stimulation Location  Rt posterior hip distal QL to piriformis to posterior greater trochanter     Electrical Stimulation Action  IFC    Electrical Stimulation Parameters  to tolerance       Manual Therapy   Manual Therapy  Muscle Energy Technique;Soft tissue mobilization    Soft tissue mobilization  TPR to Rt iliacus    Muscle Energy Technique  MET to correct Rt sacral torsion and Rt ant rotated ilium (supine/ standing/ supine)              PT Education - 10/04/17 1219    Education Details  palpation of ASIS and self-correction technique of pelvis with MET; rationale of MET     Person(s) Educated  Patient    Methods  Explanation;Demonstration;Tactile cues;Verbal cues    Comprehension  Verbalized understanding;Returned demonstration          PT Long Term Goals - 09/30/17 1727      PT LONG TERM GOAL #1   Title  Decrease pain by 50-75% allowing patient to resume more normal functional activities with less pain and postural changes 11/11/17    Time  6    Period  Weeks    Status  New      PT  LONG TERM GOAL #2   Title  improve tissue extensibility with decreased muscular tightness to palpation through the posterior and anterior Rt hip 11/11/17    Time  6    Period  Weeks    Status  New      PT LONG TERM GOAL #3   Title  Return patient to more normal exercise program safely with minimal Rt hip pain and limitations 11/11/17    Time  6    Period  Weeks    Status  New      PT LONG TERM GOAL #4   Title  Independent in HEP 11/11/17     Time  6    Period  Weeks    Status  New      PT LONG TERM GOAL #5   Title  Improve FOTO to </= 31% limitation 11/11/17    Time  6    Period  Weeks    Status  New            Plan - 10/04/17 1211    Clinical Impression Statement  Pt presents with pelvis asymmetries; slightly improved with MET corrections.  Pt very point tender in Rt iliopsoas; reduced with MFR/TPR to this area. Pt reported pain relief at end of session, after MET followed by estim/MHP to Rt ant/post hip.  Progressing towards goals.     Rehab Potential  Good    PT Frequency  2x / week    PT Duration  6 weeks    PT Treatment/Interventions  Patient/family education;ADLs/Self Care Home Management;Cryotherapy;Electrical Stimulation;Iontophoresis 41m/ml Dexamethasone;Moist Heat;Ultrasound;Dry needling;Manual techniques;Neuromuscular re-education;Therapeutic activities;Therapeutic exercise    PT Next Visit Plan  assess pelvis alignment, manual therapy to hips.     PT Home Exercise Plan  seated hip flexor stretch; bridges with ball squeeze; dial back hip abdct with band strengthening; self MET correction as needed (supine with dowel vs standing with foot on elevated surface)     Consulted and Agree with Plan of Care  Patient       Patient will benefit from skilled therapeutic intervention in order to  improve the following deficits and impairments:  Postural dysfunction, Improper body mechanics, Pain, Increased fascial restricitons, Increased muscle spasms, Decreased mobility,  Decreased range of motion, Decreased activity tolerance  Visit Diagnosis: Pain in right hip  Other symptoms and signs involving the musculoskeletal system     Problem List Patient Active Problem List   Diagnosis Date Noted  . Hip abductor tendinitis, right 09/27/2017  . MDD (major depressive disorder) 09/27/2017  . Anxiety 09/27/2017  . Acute bronchitis 04/18/2017  . 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 MTHFR mutation ([place]) 03/14/2017  . Monoallelic mutation of [id number] gene 03/14/2017  . HLD (hyperlipidemia) 03/05/2017  . Lumbar herniated disc 08/28/2016  . Eustachian tube dysfunction, left 07/16/2016  . Pulmonary HTN ([place]) 10/14/2015  . Elevated cortisol level ([place]) 04/25/2015  . Otitis externa, mycotic 01/19/2015  . LPRD (laryngopharyngeal reflux disease) 10/06/2014  . 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 Vitamin D deficiency 08/22/2012  . Breast mass, right 08/05/2012  . Serotonin syndrome (History of) 08/05/2012   Kerin Perna, PTA 10/04/17 12:25 PM  [place], [place], [address] Phone: [phone]   Fax:  [phone]  Name: Marce Schartz MRN: [id number] Date of Birth: [birth year]

## 2017-10-07 ENCOUNTER — Ambulatory Visit (INDEPENDENT_AMBULATORY_CARE_PROVIDER_SITE_OTHER): Payer: Managed Care, Other (non HMO) | Admitting: Physical Therapy

## 2017-10-07 DIAGNOSIS — M25551 Pain in right hip: Secondary | ICD-10-CM | POA: Diagnosis not present

## 2017-10-07 DIAGNOSIS — R29898 Other symptoms and signs involving the musculoskeletal system: Secondary | ICD-10-CM

## 2017-10-07 NOTE — Therapy (Signed)
[place], [place], [address] Phone: [phone]   Fax:  [phone]  Physical Therapy Treatment  Patient Details  Name: Dana Gomez MRN: [id number] Date of Birth: [birth year] Referring Provider: Dr. Lynne Leader   Encounter Date: 10/07/2017  PT End of Session - 10/07/17 0726    Visit Number  3    Number of Visits  12    Date for PT Re-Evaluation  11/11/17    PT Start Time  0727   pt arrived late   PT Stop Time  0813    PT Time Calculation (min)  46 min    Activity Tolerance  Patient tolerated treatment well;No increased pain    Behavior During Therapy  WFL for tasks assessed/performed       Past Medical History:  Diagnosis Date  . Asthma   . Hyperlipidemia   . MDD (major depressive disorder) 09/27/2017  . 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 NASAL SINUS SURGERY  1999  . schleral buckle  2003,2005    There were no vitals filed for this visit.  Subjective Assessment - 10/07/17 0727    Subjective  "I'm doing much better". she walked some without increase in pain.  she has been using TENS on her hip with some relief.       Patient Stated Goals  get rid of the hip pain and continue to exercise     Currently in Pain?  Yes    Pain Score  2     Pain Location  Hip    Pain Orientation  Right;Posterior;Anterior;Lateral    Aggravating Factors   sitting; stairs, hardwood floors    Pain Relieving Factors  sleep; better in the morning.          [facility] PT Assessment - 10/07/17 0001      Assessment   Medical Diagnosis  Rt hip pain     Referring Provider  Dr. Lynne Leader    Onset Date/Surgical Date  03/15/17    Hand Dominance  Right    Next MD Visit  PRN       Palpation   SI assessment   Rt ilium high in standing; Lt sacral torsion; = ASIS in standing    Palpation comment  point tender Rt QL, Rt glute min/med       [facility] Adult PT  Treatment/Exercise - 10/07/17 0001      Lumbar Exercises: Supine   Bridge  10 reps;5 seconds   with ball squeeze after MET corrections     Knee/Hip Exercises: Stretches   Hip Flexor Stretch Limitations  Lt / Rt seated hip flexor stretch with arm overhead     Piriformis Stretch  Right;2 reps;60 seconds;Left;30 seconds      Moist Heat Therapy   Number Minutes Moist Heat  15 Minutes    Moist Heat Location  Hip;Lumbar Spine   anterior and posterior hip      Electrical Stimulation   Electrical Stimulation Location  Rt posterior hip    Electrical Stimulation Action  IFC    Electrical Stimulation Parameters  to tolerance     Electrical Stimulation Goals  Pain;Tone      Manual Therapy   Manual Therapy  Soft tissue mobilization;Muscle Energy Technique    Soft tissue mobilization  TPR with active contract/relax to Rt glute med, QL     Muscle Energy Technique  MET to correct Lt  sacral torsion and Rt elevated ilium           PT Long Term Goals - 09/30/17 1727      PT LONG TERM GOAL #1   Title  Decrease pain by 50-75% allowing patient to resume more normal functional activities with less pain and postural changes 11/11/17    Time  6    Period  Weeks    Status  New      PT LONG TERM GOAL #2   Title  improve tissue extensibility with decreased muscular tightness to palpation through the posterior and anterior Rt hip 11/11/17    Time  6    Period  Weeks    Status  New      PT LONG TERM GOAL #3   Title  Return patient to more normal exercise program safely with minimal Rt hip pain and limitations 11/11/17    Time  6    Period  Weeks    Status  New      PT LONG TERM GOAL #4   Title  Independent in HEP 11/11/17     Time  6    Period  Weeks    Status  New      PT LONG TERM GOAL #5   Title  Improve FOTO to </= 31% limitation 11/11/17    Time  6    Period  Weeks    Status  New            Plan - 10/07/17 0759    Clinical Impression Statement  Pt's ASIS level today,  however Rt ilium elevated in standing.  She continues to have multiple trigger points in Rt hip; reducted tightness with TPR to area.  Pt reported reduced pain at end of session following Estim/MHP.     Rehab Potential  Good    PT Frequency  2x / week    PT Duration  6 weeks    PT Treatment/Interventions  Patient/family education;ADLs/Self Care Home Management;Cryotherapy;Electrical Stimulation;Iontophoresis 56m/ml Dexamethasone;Moist Heat;Ultrasound;Dry needling;Manual techniques;Neuromuscular re-education;Therapeutic activities;Therapeutic exercise    PT Next Visit Plan  assess pelvis alignment, manual therapy to hips (QL/psoas/glute med)    Consulted and Agree with Plan of Care  Patient       Patient will benefit from skilled therapeutic intervention in order to improve the following deficits and impairments:  Postural dysfunction, Improper body mechanics, Pain, Increased fascial restricitons, Increased muscle spasms, Decreased mobility, Decreased range of motion, Decreased activity tolerance  Visit Diagnosis: Pain in right hip  Other symptoms and signs involving the musculoskeletal system     Problem List Patient Active Problem List   Diagnosis Date Noted  . Hip abductor tendinitis, right 09/27/2017  . MDD (major depressive disorder) 09/27/2017  . Anxiety 09/27/2017  . Acute bronchitis 04/18/2017  . 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 MTHFR mutation ([place] 03/14/2017  . Monoallelic mutation of [id number] 03/14/2017  . HLD (hyperlipidemia) 03/05/2017  . Lumbar herniated disc 08/28/2016  . Eustachian tube dysfunction, left 07/16/2016  . Pulmonary HTN ([place] 10/14/2015  . Elevated cortisol [place] ([place] 04/25/2015  . Otitis externa, mycotic 01/19/2015  . LPRD (laryngopharyngeal reflux disease) 10/06/2014  . 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 Vitamin D deficiency 08/22/2012  . Breast mass, right 08/05/2012  .  Serotonin syndrome (History of) 08/05/2012   JKerin Perna PTA 10/07/17 9:28 AM  [place] [address]  [place], [place], [address] Phone: [phone]   Fax:  [phone]  Name: Dana Gomez MRN: [id number] Date of Birth: 12-24-1975

## 2017-10-14 ENCOUNTER — Ambulatory Visit (INDEPENDENT_AMBULATORY_CARE_PROVIDER_SITE_OTHER): Payer: Managed Care, Other (non HMO) | Admitting: Rehabilitative and Restorative Service Providers"

## 2017-10-14 ENCOUNTER — Encounter: Payer: Self-pay | Admitting: Rehabilitative and Restorative Service Providers"

## 2017-10-14 DIAGNOSIS — R29898 Other symptoms and signs involving the musculoskeletal system: Secondary | ICD-10-CM

## 2017-10-14 DIAGNOSIS — M25551 Pain in right hip: Secondary | ICD-10-CM

## 2017-10-14 NOTE — Therapy (Signed)
[facility] [place] [address] [place], [place], [address] Phone: [phone]   Fax:  [phone]  Physical Therapy Treatment  Patient Details  Name: Dana Gomez MRN: [id number] Date of Birth: 1975-05-27 Referring Provider (PT): Dr. Clementeen Graham   Encounter Date: 10/14/2017  PT End of Session - 10/14/17 0810    Visit Number  4    Number of Visits  12    Date for PT Re-Evaluation  11/11/17    PT Start Time  0810    PT Stop Time  0902    PT Time Calculation (min)  52 min    Activity Tolerance  Patient tolerated treatment well       Past Medical History:  Diagnosis Date  . Asthma   . Hyperlipidemia   . MDD (major depressive disorder) 09/27/2017  . 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 NASAL SINUS SURGERY  1999  . schleral buckle  2003,2005    There were no vitals filed for this visit.                    [facility] Adult PT Treatment/Exercise - 10/14/17 0001      Knee/Hip Exercises: Stretches   Hip Flexor Stretch Limitations  Lt / Rt seated hip flexor stretch with arm overhead     Piriformis Stretch  Right;2 reps;60 seconds;Left;30 seconds    Piriformis Stretch Limitations  feels some shortening in hip flexors but tolerates piriformis without pain       Moist Heat Therapy   Number Minutes Moist Heat  20 Minutes    Moist Heat Location  Hip;Lumbar Spine   anterior and posterior hip      Electrical Stimulation   Electrical Stimulation Location  Rt anterior/posterior hip    Electrical Stimulation Action  IFC    Electrical Stimulation Parameters  to tolerance    Electrical Stimulation Goals  Pain;Tone      Manual Therapy   Manual therapy comments  pt supine     Soft tissue mobilization  deep tissue work through the Rt psoas; iliacus; proximal quad; transverse abdominals; diaphragm                  PT Long Term Goals - 09/30/17 1727       PT LONG TERM GOAL #1   Title  Decrease pain by 50-75% allowing patient to resume more normal functional activities with less pain and postural changes 11/11/17    Time  6    Period  Weeks    Status  New      PT LONG TERM GOAL #2   Title  improve tissue extensibility with decreased muscular tightness to palpation through the posterior and anterior Rt hip 11/11/17    Time  6    Period  Weeks    Status  New      PT LONG TERM GOAL #3   Title  Return patient to more normal exercise program safely with minimal Rt hip pain and limitations 11/11/17    Time  6    Period  Weeks    Status  New      PT LONG TERM GOAL #4   Title  Independent in HEP 11/11/17     Time  6    Period  Weeks    Status  New      PT LONG TERM GOAL #5   Title  Improve  FOTO to </= 31% limitation 11/11/17    Time  6    Period  Weeks    Status  New            Plan - 10/14/17 0846    Clinical Impression Statement  Persistent deep tissue tightness Rt > Lt core, hip flexors; quads; piriformis; gluts. Good response to manual techniques; stretching; modalities. Gradually progressing toward goals of rehab.     Rehab Potential  Good    PT Frequency  2x / week    PT Duration  6 weeks    PT Treatment/Interventions  Patient/family education;ADLs/Self Care Home Management;Cryotherapy;Electrical Stimulation;Iontophoresis 4mg /ml Dexamethasone;Moist Heat;Ultrasound;Dry needling;Manual techniques;Neuromuscular re-education;Therapeutic activities;Therapeutic exercise    PT Next Visit Plan  assess response to deep tissue work Rt anterior hip/trunk; assess pelvis alignment, manual therapy to hips (QL/psoas/transverse abs/diaphragm/glute med)    Consulted and Agree with Plan of Care  Patient       Patient will benefit from skilled therapeutic intervention in order to improve the following deficits and impairments:  Postural dysfunction, Improper body mechanics, Pain, Increased fascial restricitons, Increased muscle  spasms, Decreased mobility, Decreased range of motion, Decreased activity tolerance  Visit Diagnosis: Pain in right hip  Other symptoms and signs involving the musculoskeletal system     Problem List Patient Active Problem List   Diagnosis Date Noted  . Hip abductor tendinitis, right 09/27/2017  . MDD (major depressive disorder) 09/27/2017  . Anxiety 09/27/2017  . Acute bronchitis 04/18/2017  . 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 MTHFR mutation (HCC) 03/14/2017  . 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 HLD (hyperlipidemia) 03/05/2017  . Lumbar herniated disc 08/28/2016  . Eustachian tube dysfunction, left 07/16/2016  . Pulmonary HTN (HCC) 10/14/2015  . Elevated cortisol level (HCC) 04/25/2015  . Otitis externa, mycotic 01/19/2015  . LPRD (laryngopharyngeal reflux disease) 10/06/2014  . 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 Vitamin D deficiency 08/22/2012  . Breast mass, right 08/05/2012  . Serotonin syndrome (History of) 08/05/2012    Khylin Gutridge Rober Minion PT, MPH  10/14/2017, 10:08 AM  [facility] [address] [place], [place], [address] Phone: [phone]   Fax:  [phone]  Name: Dana Gomez MRN: [id number] Date of Birth: [birth year]

## 2017-10-16 ENCOUNTER — Encounter: Payer: Self-pay | Admitting: Family Medicine

## 2017-10-16 ENCOUNTER — Ambulatory Visit: Payer: Managed Care, Other (non HMO) | Admitting: Sports Medicine

## 2017-10-17 ENCOUNTER — Encounter: Payer: Self-pay | Admitting: Rehabilitative and Restorative Service Providers"

## 2017-10-17 ENCOUNTER — Ambulatory Visit (INDEPENDENT_AMBULATORY_CARE_PROVIDER_SITE_OTHER): Payer: Managed Care, Other (non HMO) | Admitting: Rehabilitative and Restorative Service Providers"

## 2017-10-17 DIAGNOSIS — M25551 Pain in right hip: Secondary | ICD-10-CM | POA: Diagnosis not present

## 2017-10-17 DIAGNOSIS — R29898 Other symptoms and signs involving the musculoskeletal system: Secondary | ICD-10-CM | POA: Diagnosis not present

## 2017-10-17 NOTE — Patient Instructions (Addendum)
Side Bend, Sitting feet flat on the floor     Sit cross-legged on floor. Bend to one side, touching elbow to floor. Hold __30 _ seconds. To increase stretch, raise other arm above head.  Repeat _3__ times per session. Do _2-3_ sessions per day.

## 2017-10-17 NOTE — Therapy (Signed)
[facility] [place] [address] [place], [place], [address] Phone: [phone]   Fax:  [phone]  Physical Therapy Treatment  Patient Details  Name: Dana Gomez MRN: [id number] Date of Birth: [birth year] Referring Provider (PT): Dr. Clementeen Graham   Encounter Date: 10/17/2017  PT End of Session - 10/17/17 0810    Visit Number  5    Number of Visits  12    Date for PT Re-Evaluation  11/11/17    PT Start Time  0809    PT Stop Time  0905    PT Time Calculation (min)  56 min       Past Medical History:  Diagnosis Date  . Asthma   . Hyperlipidemia   . MDD (major depressive disorder) 09/27/2017  . 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 NASAL SINUS SURGERY  1999  . schleral buckle  2003,2005    There were no vitals filed for this visit.  Subjective Assessment - 10/17/17 0816    Subjective  some continued improvement. Now noticing the deep ache in hips preventing her from sleeping     Currently in Pain?  Yes    Pain Score  4     Pain Orientation  Right;Left    Pain Descriptors / Indicators  Aching;Sharp    Pain Type  Chronic pain                       [facility] Adult PT Treatment/Exercise - 10/17/17 0001      Lumbar Exercises: Stretches   Hip Flexor Stretch  Right;Left;2 reps;30 seconds   supine thomas position   Quad Stretch  Right;2 reps;30 seconds   PT assist pt in prone    Other Lumbar Stretch Exercise  seated lateral trunk flexion to Lt x 2; Rt x 1 30 sec       Moist Heat Therapy   Number Minutes Moist Heat  15 Minutes    Moist Heat Location  Hip;Lumbar Spine   anterior and posterior hip      Electrical Stimulation   Electrical Stimulation Location  Rt anterior/posterior hip    Electrical Stimulation Action  IFC    Electrical Stimulation Parameters  to tolerance    Electrical Stimulation Goals  Pain;Tone      Manual Therapy   Manual  therapy comments  pt prone     Joint Mobilization  PA mobs bilat hips pt prone     Soft tissue mobilization  deep tissue work through the Rt QL/lats; bilat posterior hips     Passive ROM  hip IR/ER knee in flexion pt prone                   PT Long Term Goals - 09/30/17 1727      PT LONG TERM GOAL #1   Title  Decrease pain by 50-75% allowing patient to resume more normal functional activities with less pain and postural changes 11/11/17    Time  6    Period  Weeks    Status  New      PT LONG TERM GOAL #2   Title  improve tissue extensibility with decreased muscular tightness to palpation through the posterior and anterior Rt hip 11/11/17    Time  6    Period  Weeks    Status  New      PT LONG TERM GOAL #3  Title  Return patient to more normal exercise program safely with minimal Rt hip pain and limitations 11/11/17    Time  6    Period  Weeks    Status  New      PT LONG TERM GOAL #4   Title  Independent in HEP 11/11/17     Time  6    Period  Weeks    Status  New      PT LONG TERM GOAL #5   Title  Improve FOTO to </= 31% limitation 11/11/17    Time  6    Period  Weeks    Status  New            Plan - 10/17/17 4098    Clinical Impression Statement  Persistent tightness and now notices deep adhe in the hips - which has been there she is just now noticing the ache more. Working on the stretching at home. Some improvement in tissue extensibiliity noted. Gradual progress toward goals. Chronic problems.     Rehab Potential  Good    PT Frequency  2x / week    PT Duration  6 weeks    PT Treatment/Interventions  Patient/family education;ADLs/Self Care Home Management;Cryotherapy;Electrical Stimulation;Iontophoresis 4mg /ml Dexamethasone;Moist Heat;Ultrasound;Dry needling;Manual techniques;Neuromuscular re-education;Therapeutic activities;Therapeutic exercise    PT Next Visit Plan  continue deep tissue work Rt anterior hip/trunk; assess pelvis alignment, manual  therapy to hips (QL/psoas/transverse abs/diaphragm/glute med)    Consulted and Agree with Plan of Care  Patient       Patient will benefit from skilled therapeutic intervention in order to improve the following deficits and impairments:  Postural dysfunction, Improper body mechanics, Pain, Increased fascial restricitons, Increased muscle spasms, Decreased mobility, Decreased range of motion, Decreased activity tolerance  Visit Diagnosis: Pain in right hip  Other symptoms and signs involving the musculoskeletal system     Problem List Patient Active Problem List   Diagnosis Date Noted  . Hip abductor tendinitis, right 09/27/2017  . MDD (major depressive disorder) 09/27/2017  . Anxiety 09/27/2017  . Acute bronchitis 04/18/2017  . 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 MTHFR mutation (HCC) 03/14/2017  . 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 HLD (hyperlipidemia) 03/05/2017  . Lumbar herniated disc 08/28/2016  . Eustachian tube dysfunction, left 07/16/2016  . Pulmonary HTN (HCC) 10/14/2015  . Elevated cortisol level (HCC) 04/25/2015  . Otitis externa, mycotic 01/19/2015  . LPRD (laryngopharyngeal reflux disease) 10/06/2014  . 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 Vitamin D deficiency 08/22/2012  . Breast mass, right 08/05/2012  . Serotonin syndrome (History of) 08/05/2012    Celyn Rober Minion PT, MPH  10/17/2017, 8:47 AM  [facility] [address] [place], [place], [address] Phone: [phone]   Fax:  [phone]  Name: Dana Gomez MRN: [id number] Date of Birth: 1975-10-15

## 2017-10-21 ENCOUNTER — Ambulatory Visit (INDEPENDENT_AMBULATORY_CARE_PROVIDER_SITE_OTHER): Payer: Managed Care, Other (non HMO) | Admitting: Physical Therapy

## 2017-10-21 DIAGNOSIS — R29898 Other symptoms and signs involving the musculoskeletal system: Secondary | ICD-10-CM

## 2017-10-21 DIAGNOSIS — M25551 Pain in right hip: Secondary | ICD-10-CM | POA: Diagnosis not present

## 2017-10-21 NOTE — Therapy (Signed)
[facility] [place] [address] [address] [place], [place], [address] Phone: [phone]   Fax:  [phone]  Physical Therapy Treatment  Patient Details  Name: Dana Gomez MRN: [id number] Date of Birth: Nov 12, 1975 Referring Provider (PT): Dr. Clementeen Graham   Encounter Date: 10/21/2017  PT End of Session - 10/21/17 0812    Visit Number  6    Number of Visits  12    Date for PT Re-Evaluation  11/11/17    PT Start Time  0813   Pt arrived late   PT Stop Time  0913    PT Time Calculation (min)  60 min    Activity Tolerance  Patient tolerated treatment well;No increased pain    Behavior During Therapy  WFL for tasks assessed/performed       Past Medical History:  Diagnosis Date  . Asthma   . Hyperlipidemia   . MDD (major depressive disorder) 09/27/2017  . 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 NASAL SINUS SURGERY  1999  . schleral buckle  2003,2005    There were no vitals filed for this visit.  Subjective Assessment - 10/21/17 0813    Subjective  Pt reports she is having less pain than before.  she is not having to take as much medicine. "My glutes are really tight. I feel like I have lots of knots in my thighs and glutes".  she reports she have been doing stretches and self massage.  She is anxious to return to running.    Patient Stated Goals  get rid of the hip pain and continue to exercise     Currently in Pain?  Yes    Pain Score  2     Pain Location  Hip    Pain Orientation  Right;Left    Pain Descriptors / Indicators  Dull;Aching    Aggravating Factors   sitting; lying down with legs flat    Pain Relieving Factors  better in morning, estim.          [facility] PT Assessment - 10/21/17 0001      Assessment   Medical Diagnosis  Rt hip pain     Referring Provider (PT)  Dr. Clementeen Graham    Onset Date/Surgical Date  03/15/17    Hand Dominance  Right    Next MD Visit   PRN       Palpation   SI assessment   iliac crests, ASIS, PSIS all =.   sacrum level in prone.        [facility] Adult PT Treatment/Exercise - 10/21/17 0001      Exercises   Exercises  Lumbar      Lumbar Exercises: Stretches   Quad Stretch  Right;1 rep;10 seconds   prone with strap; pt reported increased knee pain; stopped   ITB Stretch  Right;Left;1 rep;20 seconds    Other Lumbar Stretch Exercise  pigeon pose, modified then regular x 45 sec each    Other Lumbar Stretch Exercise  various quad stretch poses x 15 sec each (yoga poses preferred by patient), seated QL stretch x 25 sec each side.      Lumbar Exercises: Aerobic   Elliptical  L1: 3 min       Lumbar Exercises: Quadruped   Other Quadruped Lumbar Exercises  childs pose with lateral flexion x 20 sec x 2 reps each      Moist Heat Therapy   Number Minutes  Moist Heat  15 Minutes    Moist Heat Location  Hip;Lumbar Spine   anterior and posterior hip      Electrical Stimulation   Electrical Stimulation Location  bilat posterior hips    Electrical Stimulation Action  premod to each area    Electrical Stimulation Parameters  to tolerance    Electrical Stimulation Goals  Pain      Manual Therapy   Joint Mobilization  PA mobs bilat hips pt prone     Soft tissue mobilization  TPR to bilat glute med/min, piriformis, hip internal rotators with contract/relax     Passive ROM  hip IR/ER knee in flexion pt prone              PT Education - 10/21/17 0909    Education Details  Pt educated on anatomy of hip and how it correlates to stretches, pain patterns, etc.   If returning to running; encouraged to start with 1 mile walk jog progression (ie: run 1 min, walk 30 sec).    Person(s) Educated  Patient    Methods  Explanation    Comprehension  Verbalized understanding          PT Long Term Goals - 10/21/17 0912      PT LONG TERM GOAL #1   Title  Decrease pain by 50-75% allowing patient to resume more normal functional  activities with less pain and postural changes 11/11/17    Time  6    Period  Weeks    Status  On-going      PT LONG TERM GOAL #2   Title  improve tissue extensibility with decreased muscular tightness to palpation through the posterior and anterior Rt hip 11/11/17    Time  6    Period  Weeks    Status  On-going      PT LONG TERM GOAL #3   Title  Return patient to more normal exercise program safely with minimal Rt hip pain and limitations 11/11/17    Time  6    Period  Weeks    Status  On-going      PT LONG TERM GOAL #4   Title  Independent in HEP 11/11/17     Time  6    Period  Weeks    Status  On-going      PT LONG TERM GOAL #5   Title  Improve FOTO to </= 31% limitation 11/11/17    Time  6    Period  Weeks    Status  On-going            Plan - 10/21/17 0909    Clinical Impression Statement  Pt's pelvis appeared to be in good alignment today.  Pt reporting less pain throughout hips overall (50%). Pt reports increased knee pain with prone quad stretch; pt demonstrated alternate versions without knee pain. Point tenderness and tightness noted in bilat glute med/min, obturator internus, and piriformis.  Pt progressing towards goals.       Rehab Potential  Good    PT Frequency  2x / week    PT Duration  6 weeks    PT Treatment/Interventions  Patient/family education;ADLs/Self Care Home Management;Cryotherapy;Electrical Stimulation;Iontophoresis 4mg /ml Dexamethasone;Moist Heat;Ultrasound;Dry needling;Manual techniques;Neuromuscular re-education;Therapeutic activities;Therapeutic exercise    PT Next Visit Plan  continue deep tissue work Rt anterior hip/trunk; assess pelvis alignment, manual therapy to hips (QL/psoas/transverse abs/diaphragm/glute med)    Consulted and Agree with Plan of Care  Patient  Patient will benefit from skilled therapeutic intervention in order to improve the following deficits and impairments:  Postural dysfunction, Improper body mechanics,  Pain, Increased fascial restricitons, Increased muscle spasms, Decreased mobility, Decreased range of motion, Decreased activity tolerance  Visit Diagnosis: Pain in right hip  Other symptoms and signs involving the musculoskeletal system     Problem List Patient Active Problem List   Diagnosis Date Noted  . Hip abductor tendinitis, right 09/27/2017  . MDD (major depressive disorder) 09/27/2017  . Anxiety 09/27/2017  . Acute bronchitis 04/18/2017  . 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 MTHFR mutation (HCC) 03/14/2017  . 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 HLD (hyperlipidemia) 03/05/2017  . Lumbar herniated disc 08/28/2016  . Eustachian tube dysfunction, left 07/16/2016  . Pulmonary HTN (HCC) 10/14/2015  . Elevated cortisol level (HCC) 04/25/2015  . Otitis externa, mycotic 01/19/2015  . LPRD (laryngopharyngeal reflux disease) 10/06/2014  . 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 Vitamin D deficiency 08/22/2012  . Breast mass, right 08/05/2012  . Serotonin syndrome (History of) 08/05/2012   Mayer Camel, PTA 10/21/17 9:32 AM  [facility] [place] [address] [place] [address] [place], [place], [address] Phone: [phone]   Fax:  [phone]  Name: Dana Gomez MRN: [id number] Date of Birth: [birth year]

## 2017-10-23 ENCOUNTER — Encounter: Payer: Self-pay | Admitting: Family Medicine

## 2017-10-24 ENCOUNTER — Ambulatory Visit (INDEPENDENT_AMBULATORY_CARE_PROVIDER_SITE_OTHER): Payer: Managed Care, Other (non HMO) | Admitting: Physical Therapy

## 2017-10-24 ENCOUNTER — Encounter: Payer: Self-pay | Admitting: Physical Therapy

## 2017-10-24 DIAGNOSIS — R29898 Other symptoms and signs involving the musculoskeletal system: Secondary | ICD-10-CM | POA: Diagnosis not present

## 2017-10-24 DIAGNOSIS — M25551 Pain in right hip: Secondary | ICD-10-CM | POA: Diagnosis not present

## 2017-10-24 NOTE — Therapy (Signed)
[facility] [place] [address] [place] [address] [place], [place], [address] Phone: [phone]   Fax:  [phone]  Physical Therapy Treatment  Patient Details  Name: Dana Gomez MRN: [id number] Date of Birth: 29-Mar-1975 Referring Provider (PT): Dr. Clementeen Graham   Encounter Date: 10/24/2017  PT End of Session - 10/24/17 0930    Visit Number  7    Number of Visits  12    Date for PT Re-Evaluation  11/11/17    PT Start Time  0848    PT Stop Time  0943    PT Time Calculation (min)  55 min    Behavior During Therapy  [facility] for tasks assessed/performed       Past Medical History:  Diagnosis Date  . Asthma   . Hyperlipidemia   . MDD (major depressive disorder) 09/27/2017  . 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 NASAL SINUS SURGERY  1999  . schleral buckle  2003,2005    There were no vitals filed for this visit.  Subjective Assessment - 10/24/17 0855    Subjective  Pt reports her pain in Rt hip worsened after last visit. Couldn't sleep and had to take Aleven.  She went for walk/run, 2.3 miles - had some discomfort, but not as much pain as months ago.     Patient Stated Goals  get rid of the hip pain and continue to exercise     Currently in Pain?  Yes    Pain Score  3     Pain Location  Hip    Pain Orientation  Right    Pain Descriptors / Indicators  Sore    Aggravating Factors   hills    Pain Relieving Factors  estim, better in morning.          [facility] PT Assessment - 10/24/17 0001      Assessment   Medical Diagnosis  Rt hip pain     Referring Provider (PT)  Dr. Clementeen Graham    Onset Date/Surgical Date  03/15/17    Hand Dominance  Right    Next MD Visit  PRN          [facility] Adult PT Treatment/Exercise - 10/24/17 0001      Lumbar Exercises: Stretches   Hip Flexor Stretch  Right;Left;2 reps;30 seconds   high kneeling    Quad Stretch  Right;Left;2 reps;30 seconds    kneeling, leaning forward   Other Lumbar Stretch Exercise  pigeon pose, 60 sec each x 2    Other Lumbar Stretch Exercise  triangle pose x 15 sec x 2       Lumbar Exercises: Aerobic   Elliptical  L1: 4 min       Lumbar Exercises: Quadruped   Other Quadruped Lumbar Exercises  childs pose with Lt lateral flexion x 20 sec x 2 reps each      Moist Heat Therapy   Number Minutes Moist Heat  15 Minutes    Moist Heat Location  Hip;Lumbar Spine   anterior and posterior hip      Electrical Stimulation   Electrical Stimulation Location  Rt ant hip and rib/  Rt piriformis    Electrical Stimulation Action  premod to each area    Electrical Stimulation Parameters   to tolerance    Electrical Stimulation Goals  Pain      Manual Therapy   Manual Therapy  Myofascial release;Soft tissue mobilization  Manual therapy comments  pt very point tender on end of Rt 11th rib. Tender to palpation along distal border of Rt ribs (some radiating pain into chest)     Soft tissue mobilization  deep tissue to Rt glute, hip rotators (while in prone); STM to internal/external obliques on Rt, Rt QL.      Myofascial Release  MFR to Rt flank / obliques;  MFR to Rt iliacus / psoas.                   PT Long Term Goals - 10/21/17 0912      PT LONG TERM GOAL #1   Title  Decrease pain by 50-75% allowing patient to resume more normal functional activities with less pain and postural changes 11/11/17    Time  6    Period  Weeks    Status  On-going      PT LONG TERM GOAL #2   Title  improve tissue extensibility with decreased muscular tightness to palpation through the posterior and anterior Rt hip 11/11/17    Time  6    Period  Weeks    Status  On-going      PT LONG TERM GOAL #3   Title  Return patient to more normal exercise program safely with minimal Rt hip pain and limitations 11/11/17    Time  6    Period  Weeks    Status  On-going      PT LONG TERM GOAL #4   Title  Independent in HEP  11/11/17     Time  6    Period  Weeks    Status  On-going      PT LONG TERM GOAL #5   Title  Improve FOTO to </= 31% limitation 11/11/17    Time  6    Period  Weeks    Status  On-going            Plan - 10/24/17 1405    Clinical Impression Statement  Pelvis continues to be in good alignment.  Pt reporting increased pain just below Rt ant lower ribs today.  she tolerated running trial outside of therapy well, without increase of pain during or after.  Pt reporting less intense episodes of pain in Rt hip since initiating in therapy.  Pt will benefit from continued PT intervention to max functional mobility with less pain.     Rehab Potential  Good    PT Frequency  2x / week    PT Duration  6 weeks    PT Treatment/Interventions  Patient/family education;ADLs/Self Care Home Management;Cryotherapy;Electrical Stimulation;Iontophoresis 4mg /ml Dexamethasone;Moist Heat;Ultrasound;Dry needling;Manual techniques;Neuromuscular re-education;Therapeutic activities;Therapeutic exercise    PT Next Visit Plan  continue deep tissue work Rt anterior hip/trunk; assess pelvis alignment, manual therapy to hips (QL/psoas/transverse abs/diaphragm/glute med)    Consulted and Agree with Plan of Care  Patient       Patient will benefit from skilled therapeutic intervention in order to improve the following deficits and impairments:  Postural dysfunction, Improper body mechanics, Pain, Increased fascial restricitons, Increased muscle spasms, Decreased mobility, Decreased range of motion, Decreased activity tolerance  Visit Diagnosis: Pain in right hip  Other symptoms and signs involving the musculoskeletal system     Problem List Patient Active Problem List   Diagnosis Date Noted  . Hip abductor tendinitis, right 09/27/2017  . MDD (major depressive disorder) 09/27/2017  . Anxiety 09/27/2017  . Acute bronchitis 04/18/2017  . 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  MTHFR mutation (HCC)  03/14/2017  . 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 HLD (hyperlipidemia) 03/05/2017  . Lumbar herniated disc 08/28/2016  . Eustachian tube dysfunction, left 07/16/2016  . Pulmonary HTN (HCC) 10/14/2015  . Elevated cortisol level (HCC) 04/25/2015  . Otitis externa, mycotic 01/19/2015  . LPRD (laryngopharyngeal reflux disease) 10/06/2014  . 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 Vitamin D deficiency 08/22/2012  . Breast mass, right 08/05/2012  . Serotonin syndrome (History of) 08/05/2012   Mayer Camel, PTA 10/24/17 2:10 PM  [facility] [place] [address] [place] [address] [place], [place], [address] Phone: [phone]   Fax:  [phone]  Name: Dana Gomez MRN: [id number] Date of Birth: 1975-06-04

## 2017-10-29 ENCOUNTER — Encounter: Payer: Managed Care, Other (non HMO) | Admitting: Physical Therapy

## 2017-11-04 ENCOUNTER — Ambulatory Visit (INDEPENDENT_AMBULATORY_CARE_PROVIDER_SITE_OTHER): Payer: Managed Care, Other (non HMO) | Admitting: Physical Therapy

## 2017-11-04 ENCOUNTER — Encounter: Payer: Self-pay | Admitting: Physical Therapy

## 2017-11-04 DIAGNOSIS — R29898 Other symptoms and signs involving the musculoskeletal system: Secondary | ICD-10-CM | POA: Diagnosis not present

## 2017-11-04 DIAGNOSIS — M25551 Pain in right hip: Secondary | ICD-10-CM

## 2017-11-04 NOTE — Therapy (Signed)
[place], [place], [address] Phone: [phone]   Fax:  [phone]  Physical Therapy Treatment  Patient Details  Name: Dana Gomez MRN: [id number] Date of Birth: 04/13/1975 Referring Provider (PT): Dr. Lynne Leader   Encounter Date: 11/04/2017  PT End of Session - 11/04/17 0727    Visit Number  8    Number of Visits  12    Date for PT Re-Evaluation  11/11/17    PT Start Time  0724    PT Stop Time  0814    PT Time Calculation (min)  50 min       Past Medical History:  Diagnosis Date  . Asthma   . Hyperlipidemia   . MDD (major depressive disorder) 09/27/2017  . 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 NASAL SINUS SURGERY  1999  . schleral buckle  2003,2005    There were no vitals filed for this visit.  Subjective Assessment - 11/04/17 0727    Subjective  Pt has been traveling for a week.  Hasn't had to do much exercise or stretching.  "My hips have been pretty friendly".  she has a dull ache in both hips but not as intense as it once was.     Patient Stated Goals  get rid of the hip pain and continue to exercise     Currently in Pain?  Yes    Pain Score  3     Pain Location  Hip    Pain Orientation  Right;Left    Pain Descriptors / Indicators  Aching    Aggravating Factors   hills    Pain Relieving Factors  estim, better in morning         [facility] PT Assessment - 11/04/17 0001      Assessment   Medical Diagnosis  Rt hip pain     Referring Provider (PT)  Dr. Lynne Leader    Onset Date/Surgical Date  03/15/17    Hand Dominance  Right    Next MD Visit  PRN       Palpation   SI assessment   iliac crests, ASIS, PSIS all =.   sacrum level in prone.        [place] Adult PT Treatment/Exercise - 11/04/17 0001      Lumbar Exercises: Stretches   Hip Flexor Stretch  Right;Left;2 reps;30 seconds   high kneeling    Quad Stretch   Right;Left;2 reps;30 seconds   kneeling, leaning forward   Other Lumbar Stretch Exercise  pigeon pose, 30 sec each x 2    Other Lumbar Stretch Exercise  triangle pose x 15 sec x 2       Lumbar Exercises: Aerobic   Elliptical  L1: 2 min       Lumbar Exercises: Quadruped   Other Quadruped Lumbar Exercises  childs pose with Lt lateral flexion x 20 sec x 2 reps each      Moist Heat Therapy   Number Minutes Moist Heat  15 Minutes    Moist Heat Location  Hip;Lumbar Spine   anterior and posterior hip      Electrical Stimulation   Electrical Stimulation Location  Rt anterior/posterior hip    Electrical Stimulation Parameters  to pt tolerance     Electrical Stimulation Goals  Pain      Manual Therapy   Soft tissue mobilization  TPR with rocking and active motion  to Rt adductors, hip flexors, hip rotators     Myofascial Release  MFR to Rt lateral quad and TFL          PT Long Term Goals - 11/04/17 0810      PT LONG TERM GOAL #1   Title  Decrease pain by 50-75% allowing patient to resume more normal functional activities with less pain and postural changes 11/11/17    Time  6    Period  Weeks    Status  Achieved      PT LONG TERM GOAL #2   Title  improve tissue extensibility with decreased muscular tightness to palpation through the posterior and anterior Rt hip 11/11/17    Time  6    Period  Weeks    Status  On-going   improving     PT LONG TERM GOAL #3   Title  Return patient to more normal exercise program safely with minimal Rt hip pain and limitations 11/11/17    Time  6    Period  Weeks    Status  On-going      PT LONG TERM GOAL #4   Title  Independent in HEP 11/11/17     Time  6    Status  On-going      PT LONG TERM GOAL #5   Title  Improve FOTO to </= 31% limitation 11/11/17    Time  6    Period  Weeks    Status  On-going            Plan - 11/04/17 0753    Clinical Impression Statement  ASIS level as was ilium.  Pt had many trigger points in Rt  adductors, glute min, TFL; reduced with manual therapy.  Pt reporting 70% reduction in pain since starting therapy; has met LTG #1.      Rehab Potential  Good    PT Frequency  2x / week    PT Duration  6 weeks    PT Treatment/Interventions  Patient/family education;ADLs/Self Care Home Management;Cryotherapy;Electrical Stimulation;Iontophoresis 35m/ml Dexamethasone;Moist Heat;Ultrasound;Dry needling;Manual techniques;Neuromuscular re-education;Therapeutic activities;Therapeutic exercise    PT Next Visit Plan  continue deep tissue work Rt anterior hip/trunk; assess pelvis alignment, manual therapy to hips (secondary hip flexors)    Consulted and Agree with Plan of Care  Patient       Patient will benefit from skilled therapeutic intervention in order to improve the following deficits and impairments:  Postural dysfunction, Improper body mechanics, Pain, Increased fascial restricitons, Increased muscle spasms, Decreased mobility, Decreased range of motion, Decreased activity tolerance  Visit Diagnosis: Pain in right hip  Other symptoms and signs involving the musculoskeletal system     Problem List Patient Active Problem List   Diagnosis Date Noted  . Hip abductor tendinitis, right 09/27/2017  . MDD (major depressive disorder) 09/27/2017  . Anxiety 09/27/2017  . Acute bronchitis 04/18/2017  . 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 MTHFR mutation ([place] 03/14/2017  . Monoallelic mutation of [id number] 03/14/2017  . HLD (hyperlipidemia) 03/05/2017  . Lumbar herniated disc 08/28/2016  . Eustachian tube dysfunction, left 07/16/2016  . Pulmonary HTN ([place] 10/14/2015  . Elevated cortisol level ([place] 04/25/2015  . Otitis externa, mycotic 01/19/2015  . LPRD (laryngopharyngeal reflux disease) 10/06/2014  . 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 Vitamin D deficiency 08/22/2012  . Breast mass, right 08/05/2012  . Serotonin syndrome  (History of) 08/05/2012   JAnderson Malta  [place], PTA 11/04/17 8:11 AM  [place], [place], [address] Phone: [phone]   Fax:  [phone]  Name: Dana Gomez MRN: [id number] Date of Birth: January 08, 1976

## 2017-11-07 ENCOUNTER — Ambulatory Visit (INDEPENDENT_AMBULATORY_CARE_PROVIDER_SITE_OTHER): Payer: Managed Care, Other (non HMO) | Admitting: Physical Therapy

## 2017-11-07 ENCOUNTER — Encounter: Payer: Self-pay | Admitting: Physical Therapy

## 2017-11-07 DIAGNOSIS — M25551 Pain in right hip: Secondary | ICD-10-CM

## 2017-11-07 DIAGNOSIS — R29898 Other symptoms and signs involving the musculoskeletal system: Secondary | ICD-10-CM | POA: Diagnosis not present

## 2017-11-07 NOTE — Therapy (Signed)
[facility] [place] [address] [place] [address] [place], [place], [address] Phone: [phone]   Fax:  [phone]  Physical Therapy Treatment  Patient Details  Name: Dana Gomez MRN: [id number] Date of Birth: 02-Feb-1975 Referring Provider (PT): Dr. Clementeen Graham   Encounter Date: 11/07/2017  PT End of Session - 11/07/17 0808    Visit Number  9    Number of Visits  12    Date for PT Re-Evaluation  11/11/17    PT Start Time  0803    PT Stop Time  0906    PT Time Calculation (min)  63 min    Activity Tolerance  Patient tolerated treatment well    Behavior During Therapy  [facility] for tasks assessed/performed       Past Medical History:  Diagnosis Date  . Asthma   . Hyperlipidemia   . MDD (major depressive disorder) 09/27/2017  . 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 NASAL SINUS SURGERY  1999  . schleral buckle  2003,2005    There were no vitals filed for this visit.  Subjective Assessment - 11/07/17 0809    Subjective  Pt reports she was able to run twice this week, about 3 miles with walk/run. "I've had a tremendous shift over the last 2 wks".  Pt has some tightness in her lateral/posterior Rt hip, but much improved from when she started therapy.     Currently in Pain?  Yes    Pain Score  2     Pain Location  Hip    Pain Orientation  Right;Lateral;Proximal;Posterior    Aggravating Factors   prolonged sitting    Pain Relieving Factors  estim, morning, stretches.          [facility] PT Assessment - 11/07/17 0001      Assessment   Medical Diagnosis  Rt hip pain     Referring Provider (PT)  Dr. Clementeen Graham    Onset Date/Surgical Date  03/15/17    Hand Dominance  Right    Next MD Visit  PRN         [facility] Adult PT Treatment/Exercise - 11/07/17 0001      Exercises   Exercises  Lumbar      Lumbar Exercises: Stretches   Hip Flexor Stretch  Right;Left;2 reps;30 seconds   high  kneeling    Quad Stretch  Right;Left;2 reps;30 seconds   kneeling, leaning forward   Other Lumbar Stretch Exercise  pigeon pose, 60 sec each x 2    Other Lumbar Stretch Exercise  triangle pose x 15 sec x 1; bow pose x 15 sec        Lumbar Exercises: Aerobic   Elliptical  L1: 3 min       Lumbar Exercises: Supine   Bridge  5 reps    Single Leg Bridge  5 reps   each leg     Lumbar Exercises: Sidelying   Hip Abduction  Right;Left;10 reps    Other Sidelying Lumbar Exercises  side plank (on hand and feet) x 20 sec each side       Lumbar Exercises: Prone   Other Prone Lumbar Exercises  plank x 20 sec, 2 reps       Lumbar Exercises: Quadruped   Other Quadruped Lumbar Exercises  childs pose with Lt lateral flexion x 20 sec x 2 reps each      Moist Heat Therapy   Number Minutes  Moist Heat  20 Minutes    Moist Heat Location  Hip;Lumbar Spine   Rt hamstring     Electrical Stimulation   Electrical Stimulation Location  Rt lateral hamstring; Rt QL/glute med    Statistician Action  premod to each area    Electrical Stimulation Parameters  to tolerance    Electrical Stimulation Goals  Pain      Manual Therapy   Soft tissue mobilization  TPR Rt deep hip rotators with contract relax in IR;  TPR to Rt lateral hamstring, TFL, glute med     Myofascial Release  MFR to Rt glute med and QL.                   PT Long Term Goals - 11/04/17 0810      PT LONG TERM GOAL #1   Title  Decrease pain by 50-75% allowing patient to resume more normal functional activities with less pain and postural changes 11/11/17    Time  6    Period  Weeks    Status  Achieved      PT LONG TERM GOAL #2   Title  improve tissue extensibility with decreased muscular tightness to palpation through the posterior and anterior Rt hip 11/11/17    Time  6    Period  Weeks    Status  On-going   improving     PT LONG TERM GOAL #3   Title  Return patient to more normal exercise program safely with  minimal Rt hip pain and limitations 11/11/17    Time  6    Period  Weeks    Status  On-going      PT LONG TERM GOAL #4   Title  Independent in HEP 11/11/17     Time  6    Status  On-going      PT LONG TERM GOAL #5   Title  Improve FOTO to </= 31% limitation 11/11/17    Time  6    Period  Weeks    Status  On-going            Plan - 11/07/17 4696    Clinical Impression Statement  Pt reporting improved ability to run without pain and is now able to tolerate sitting at work.  She had significant trigger point in Rt mid lateral hamstring and  obturator internus; improved with TPR and manual therapy.  Further reduction of pain with MHP/estim at end of session.   She tolerated all new exercises well, without flare up of symptoms.  Progressing well towards remaining goals.     Rehab Potential  Good    PT Frequency  2x / week    PT Duration  6 weeks    PT Treatment/Interventions  Patient/family education;ADLs/Self Care Home Management;Cryotherapy;Electrical Stimulation;Iontophoresis 4mg /ml Dexamethasone;Moist Heat;Ultrasound;Dry needling;Manual techniques;Neuromuscular re-education;Therapeutic activities;Therapeutic exercise    PT Next Visit Plan  continue deep tissue work Rt anterior hip/trunk; assess pelvis alignment, manual therapy to hips (secondary hip flexors)    Consulted and Agree with Plan of Care  Patient       Patient will benefit from skilled therapeutic intervention in order to improve the following deficits and impairments:  Postural dysfunction, Improper body mechanics, Pain, Increased fascial restricitons, Increased muscle spasms, Decreased mobility, Decreased range of motion, Decreased activity tolerance  Visit Diagnosis: Pain in right hip  Other symptoms and signs involving the musculoskeletal system     Problem List Patient Active Problem List  Diagnosis Date Noted  . Hip abductor tendinitis, right 09/27/2017  . MDD (major depressive disorder) 09/27/2017  .  Anxiety 09/27/2017  . Acute bronchitis 04/18/2017  . 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 MTHFR mutation (HCC) 03/14/2017  . 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 HLD (hyperlipidemia) 03/05/2017  . Lumbar herniated disc 08/28/2016  . Eustachian tube dysfunction, left 07/16/2016  . Pulmonary HTN (HCC) 10/14/2015  . Elevated cortisol level (HCC) 04/25/2015  . Otitis externa, mycotic 01/19/2015  . LPRD (laryngopharyngeal reflux disease) 10/06/2014  . 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 Vitamin D deficiency 08/22/2012  . Breast mass, right 08/05/2012  . Serotonin syndrome (History of) 08/05/2012   Mayer Camel, PTA 11/07/17 9:52 AM  [facility] [address] [place] [address] [place], [place], [address] Phone: [phone]   Fax:  [phone]  Name: Jeroline Wolbert MRN: [id number] Date of Birth: [birth year]

## 2017-11-11 ENCOUNTER — Ambulatory Visit (INDEPENDENT_AMBULATORY_CARE_PROVIDER_SITE_OTHER): Payer: Managed Care, Other (non HMO) | Admitting: Physical Therapy

## 2017-11-11 ENCOUNTER — Encounter: Payer: Self-pay | Admitting: Physical Therapy

## 2017-11-11 DIAGNOSIS — R29898 Other symptoms and signs involving the musculoskeletal system: Secondary | ICD-10-CM | POA: Diagnosis not present

## 2017-11-11 DIAGNOSIS — M25551 Pain in right hip: Secondary | ICD-10-CM

## 2017-11-11 NOTE — Therapy (Addendum)
[facility] [place] [place], [place], [address] Phone: [phone]   Fax:  [phone]  Physical Therapy Treatment/Recertification/Discharge  Patient Details  Name: Dana Gomez MRN: [id number] Date of Birth: 1975-04-10 Referring Provider (PT): Dr. Lynne Leader   Encounter Date: 11/11/2017  PT End of Session - 11/11/17 0811    Visit Number  10    Number of Visits  12    Date for PT Re-Evaluation  12/09/17    PT Start Time  0717    PT Stop Time  0824    PT Time Calculation (min)  67 min    Activity Tolerance  Patient tolerated treatment well    Behavior During Therapy  [facility] for tasks assessed/performed       Past Medical History:  Diagnosis Date  . Asthma   . Hyperlipidemia   . MDD (major depressive disorder) 09/27/2017  . 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 NASAL SINUS SURGERY  1999  . schleral buckle  2003,2005    There were no vitals filed for this visit.  Subjective Assessment - 11/11/17 0718    Subjective  hip is doing a lot better. wants to consider continue v/s d/c.  not back to regular exercise.  running up to 3 miles; tried 3.5 miles and was very sore.  hasn't tried to return to cross fit    Patient Stated Goals  get rid of the hip pain and continue to exercise     Currently in Pain?  Yes    Pain Score  2     Pain Location  Hip    Pain Orientation  Right;Lateral;Proximal;Posterior    Pain Onset  More than a month ago    Pain Frequency  Constant    Aggravating Factors   prolonged sittine    Pain Relieving Factors  estime, morning, stretches         [facility] PT Assessment - 11/11/17 0811      Assessment   Medical Diagnosis  Rt hip pain     Referring Provider (PT)  Dr. Lynne Leader    Onset Date/Surgical Date  03/15/17      Observation/Other Assessments   Focus on Therapeutic Outcomes (FOTO)   64 (36% limited)                    [facility] Adult PT Treatment/Exercise - 11/11/17 0722      Exercises   Exercises  Lumbar      Lumbar Exercises: Stretches   Hip Flexor Stretch  Right;Left;2 reps;30 seconds   high kneeling    ITB Stretch  Right;Left;20 seconds;3 reps      Lumbar Exercises: Aerobic   Elliptical  L2.5: 3 min       Lumbar Exercises: Standing   Other Standing Lumbar Exercises  modified cross fit activities including squats and deadlifts with cues for form; standing hip extension into hip flexion with cues for form; cues to decrease Rt hip internal rotation    Other Standing Lumbar Exercises  SLS activities for glute med activation      Lumbar Exercises: Supine   Bridge  10 reps;5 seconds      Moist Heat Therapy   Number Minutes Moist Heat  20 Minutes    Moist Heat Location  Hip;Lumbar Spine      Electrical Stimulation   Electrical Stimulation Location  Rt hip    Electrical Stimulation Action  IFC    Electrical Stimulation Parameters  to tolerance x 15 min    Electrical Stimulation Goals  Pain      Manual Therapy   Manual Therapy  Joint mobilization    Joint Mobilization  Rt hip: P/A, A/P and LAD grades 2-3    Soft tissue mobilization  IASTM Rt deep hip rotators with contract relax in IR;  TPR to Rt lateral hamstring, TFL, glute med                   PT Long Term Goals - 11/11/17 1017      PT LONG TERM GOAL #1   Title  Decrease pain by 50-75% allowing patient to resume more normal functional activities with less pain and postural changes 11/11/17    Time  6    Period  Weeks    Status  Achieved      PT LONG TERM GOAL #2   Title  improve tissue extensibility with decreased muscular tightness to palpation through the posterior and anterior Rt hip 12/09/17    Time  4    Period  Weeks    Status  On-going   improving   Target Date  12/09/17      PT LONG TERM GOAL #3   Title  Return patient to more normal exercise program safely with minimal Rt hip pain  and limitations 12/09/17    Baseline  10/28: performed today in clinic with minimal pain; has not returned to cross fit    Time  4    Period  Weeks    Status  Partially Met    Target Date  12/09/17      PT LONG TERM GOAL #4   Title  Independent in HEP 11/11/17     Time  6    Status  Achieved      PT LONG TERM GOAL #5   Title  Improve FOTO to </= 31% limitation 11/11/17    Baseline  10/28: 36% limited    Time  4    Period  Weeks    Status  On-going    Target Date  12/09/17            Plan - 11/11/17 0814    Clinical Impression Statement  Pt has met 2 LTGs and partially met 1 LTG.  Pt requesting to hold PT at this time, but will plan to continue if she needs to return within the next month. Pt is pleased with her progress at this time.  Still with some tightness and limitations but has started to return to regular exercise.    Rehab Potential  Good    PT Frequency  2x / week    PT Duration  6 weeks    PT Treatment/Interventions  Patient/family education;ADLs/Self Care Home Management;Cryotherapy;Electrical Stimulation;Iontophoresis 77m/ml Dexamethasone;Moist Heat;Ultrasound;Dry needling;Manual techniques;Neuromuscular re-education;Therapeutic activities;Therapeutic exercise    PT Next Visit Plan  continue deep tissue work Rt anterior hip/trunk; assess pelvis alignment, manual therapy to hips (secondary hip flexors)    PT Home Exercise Plan  seated hip flexor stretch; bridges with ball squeeze; dial back hip abdct with band strengthening; self MET correction as needed (supine with dowel vs standing with foot on elevated surface)     Consulted and Agree with Plan of Care  Patient       Patient will benefit from skilled therapeutic intervention in order to improve the following deficits and impairments:  Postural dysfunction, Improper body mechanics, Pain, Increased fascial  restricitons, Increased muscle spasms, Decreased mobility, Decreased range of motion, Decreased activity  tolerance  Visit Diagnosis: Pain in right hip - Plan: PT plan of care cert/re-cert  Other symptoms and signs involving the musculoskeletal system - Plan: PT plan of care cert/re-cert     Problem List Patient Active Problem List   Diagnosis Date Noted  . Hip abductor tendinitis, right 09/27/2017  . MDD (major depressive disorder) 09/27/2017  . Anxiety 09/27/2017  . Acute bronchitis 04/18/2017  . 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 MTHFR mutation ([place]) 03/14/2017  . 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 HLD (hyperlipidemia) 03/05/2017  . Lumbar herniated disc 08/28/2016  . Eustachian tube dysfunction, left 07/16/2016  . Pulmonary HTN ([place]) 10/14/2015  . Elevated cortisol level ([place]) 04/25/2015  . Otitis externa, mycotic 01/19/2015  . LPRD (laryngopharyngeal reflux disease) 10/06/2014  . 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 Vitamin D deficiency 08/22/2012  . Breast mass, right 08/05/2012  . Serotonin syndrome (History of) 08/05/2012      Laureen Abrahams, PT, DPT 11/11/17 8:18 AM    [facility] [place], [place], [address] Phone: [phone]   Fax:  [phone]  Name: Manroop Jakubowicz MRN: [id number] Date of Birth: 30-Dec-1975    PHYSICAL THERAPY DISCHARGE SUMMARY  Visits from Start of Care: 10  Current functional level related to goals / functional outcomes: See above   Remaining deficits: See above    Education / Equipment: HEP  Plan: Patient agrees to discharge.  Patient goals were partially met. Patient is being discharged due to being pleased with the current functional level.  ?????    Laureen Abrahams, PT, DPT 12/17/17 1:29 PM    [facility] [place] [address] [place], [place] [address]  [phone] (office) [phone] (fax)

## 2017-11-14 ENCOUNTER — Encounter: Payer: Managed Care, Other (non HMO) | Admitting: Physical Therapy

## 2017-11-15 ENCOUNTER — Encounter: Payer: Managed Care, Other (non HMO) | Admitting: Physical Therapy

## 2017-12-31 ENCOUNTER — Ambulatory Visit: Payer: Managed Care, Other (non HMO) | Admitting: Family Medicine

## 2018-01-01 ENCOUNTER — Ambulatory Visit: Payer: Managed Care, Other (non HMO) | Admitting: Family Medicine

## 2018-01-06 ENCOUNTER — Telehealth: Payer: Self-pay

## 2018-01-06 MED ORDER — ALPRAZOLAM 0.25 MG PO TABS: 0.2500 mg | ORAL_TABLET | Freq: Every day | ORAL | 0 refills | Status: DC | PRN

## 2018-01-06 NOTE — Telephone Encounter (Signed)
Dana Gomez called and states she is having a panic attack. She has had them before in the past and took a Xanax and it help with the panic attack. She wanted to know if she could get a short script until she can get in with Dr Denyse Amassorey. He and her husband is in the middle of separating. Please advise.

## 2018-01-06 NOTE — Telephone Encounter (Signed)
Patient advised.

## 2018-01-06 NOTE — Telephone Encounter (Signed)
rx sent

## 2018-01-22 ENCOUNTER — Ambulatory Visit (INDEPENDENT_AMBULATORY_CARE_PROVIDER_SITE_OTHER): Payer: Managed Care, Other (non HMO) | Admitting: Family Medicine

## 2018-01-22 ENCOUNTER — Encounter: Payer: Self-pay | Admitting: Family Medicine

## 2018-01-22 VITALS — BP 121/79 | HR 56 | Ht 65.5 in | Wt 155.0 lb

## 2018-01-22 DIAGNOSIS — F419 Anxiety disorder, unspecified: Secondary | ICD-10-CM

## 2018-01-22 DIAGNOSIS — F332 Major depressive disorder, recurrent severe without psychotic features: Secondary | ICD-10-CM

## 2018-01-22 MED ORDER — CLONAZEPAM 0.5 MG PO TABS: 0.5000 mg | ORAL_TABLET | Freq: Two times a day (BID) | ORAL | 0 refills | Status: DC

## 2018-01-22 NOTE — Progress Notes (Signed)
Dana Gomez is a 43 y.o. female who presents to [facility] today for mood.  Dana Gomez is just in the process now of separating from her partner.  She notes that she has had a not great relationship with her the last year or 2 and finally decided to separate about a week ago.  She notes that she has worsening anxiety and depressive symptoms as a result.  She plans on moving to [place] to live near her sister starting in a few weeks.  She notes that she has had significant worsening anxiety since.  She also notes that moving is a stressful time.  For her regardless.  She has had depression and anxiety symptoms in the past and previously was treated with Lexapro and then subsequently Effexor for depression symptoms.  This worked reasonably well however she developed high blood pressure and agitation and eventually was diagnosed with serotonin syndrome.  She is not been on SSRIs or SNRIs since.   ROS as above:  Exam:  BP 121/79   Pulse (!) 56   Ht 5' 5.5" (1.664 m)   Wt 155 lb (70.3 kg)   BMI 25.40 kg/m  Wt Readings from Last 5 Encounters:  01/22/18 155 lb (70.3 kg)  09/26/17 158 lb (71.7 kg)  08/28/17 154 lb 12.8 oz (70.2 kg)  04/18/17 153 lb (69.4 kg)  03/05/17 156 lb (70.8 kg)    Gen: Well NAD HEENT: EOMI,  MMM Lungs: Normal work of breathing. CTABL Heart: RRR no MRG Abd: NABS, Soft. Nondistended, Nontender Exts: Brisk capillary refill, warm and well perfused.  Psych: Tearful.  Normal speech and thought process.  No SI or HI expressed.  Depression screen [facility] 2/9 01/22/2018 09/27/2017 03/05/2017 10/03/2015  Decreased Interest 3 1 1 2   Down, Depressed, Hopeless 3 1 0 0  PHQ - 2 Score 6 2 1 2   Altered sleeping 3 2 - 3  Tired, decreased energy 3 2 - 3  Change in appetite 3 3 - 3  Feeling bad or failure about yourself  3 1 - 1  Trouble concentrating 3 2 - 2  Moving slowly  or fidgety/restless 3 0 - 1  Suicidal thoughts 3 0 - 0  PHQ-9 Score 27 12 - 15  Difficult doing work/chores Extremely dIfficult Somewhat difficult - -    GAD 7 : Generalized Anxiety Score 01/22/2018 09/27/2017  Nervous, Anxious, on Edge 3 2  Control/stop worrying 3 2  Worry too much - different things 3 2  Trouble relaxing 3 1  Restless 3 1  Easily annoyed or irritable 3 2  Afraid - awful might happen 3 0  Total GAD 7 Score 21 10  Anxiety Difficulty Extremely difficult Somewhat difficult      Lab and Radiology Results No results found for this or any previous visit (from the past 72 hour(s)). No results found.    Assessment and Plan: 43 y.o. female with  Anxiety and depression and acute grief around the separation of her relationship.  Fortunately Dana Gomez has good insight into her current state and in the long-term I think she is going to do just fine however the short-term I think she could use some help.  Plan to use scheduled Klonopin for about a month.  Continue counseling.  See counseling in [place] up and schedule and establish care with psychiatry in [place] as well.  Will provide Klonopin for 1 month and then if still symptomatic  consider starting SSRI.  Will use low-dose to avoid serotonin syndrome and will use likely Prozac.  She does have an existing relationship with a licensed clinical Child psychotherapist and does need to counseling regularly.  Patient researched [facility] Controlled Substance Reporting System.   No orders of the defined types were placed in this encounter.  Meds ordered this encounter  Medications  . clonazePAM (KLONOPIN) 0.5 MG tablet    Sig: Take 1 tablet (0.5 mg total) by mouth 2 (two) times daily.    Dispense:  60 tablet    Refill:  0     Historical information moved to improve visibility of documentation.  Past Medical History:  Diagnosis Date  . Asthma   . Hyperlipidemia   . MDD (major depressive disorder) 09/27/2017  . Serotonin  syndrome (History of) 08/05/2012   Past Surgical History:  Procedure Laterality Date  . NASAL SINUS SURGERY  1999  . schleral buckle  [id number]   Social History   Tobacco Use  . Smoking status: Never Smoker  . Smokeless tobacco: Never Used  Substance Use Topics  . Alcohol use: Yes    Alcohol/week: 2.0 standard drinks    Types: 2 Glasses of wine per week    Comment: occ   family history includes Alcoholism in her father and mother; Breast cancer in her maternal grandmother; Hyperlipidemia in her mother; Hypertension in her mother.  Medications: Current Outpatient Medications  Medication Sig Dispense Refill  . Cholecalciferol (VITAMIN D PO) Take 6,000 Units by mouth daily.     . montelukast (SINGULAIR) 10 MG tablet TAKE 1 TABLET BY MOUTH EVERYDAY AT BEDTIME 90 tablet 3  . clonazePAM (KLONOPIN) 0.5 MG tablet Take 1 tablet (0.5 mg total) by mouth 2 (two) times daily. 60 tablet 0   No current facility-administered medications for this visit.    Allergies  Allergen Reactions  . Effexor [Venlafaxine] Other (See Comments)    Serotonin Syndrome  . Azithromycin Rash  . Erythromycin Rash     Discussed warning signs or symptoms. Please see discharge instructions. Patient expresses understanding.

## 2018-01-22 NOTE — Patient Instructions (Addendum)
Thank you for coming in today. I recommend that you establish care with psychiatry in the [facility] area ASAP with your move.  Take Klonpopin twice daily for acute anxiety.   In 3 weeks or so if still quite bad I can refill the medicine.  Let me know what pharmacy you need it sent to.   Recheck with me if not moving in a few weeks.    Complicated Grief Grief is a normal response to the death of someone close to you. Feelings of fear, anger, and guilt can affect almost everyone who loses a loved one. It is also common to have symptoms of depression while you are grieving. These include problems with sleep, loss of appetite, and lack of energy. They may last for weeks or months after a loss. Complicated grief is different from normal grief or depression. Normal grieving involves sadness and feelings of loss, but those feelings get better and heal over time. Complicated grief is a severe type of grief that lasts for a long time, usually for several months to a year or longer. It interferes with your ability to function normally. Complicated grief may require treatment from a mental health care provider. What are the causes? The cause of this condition is not known. It is not clear why some people continue to struggle with grief and others do not. What increases the risk? You are more likely to develop this condition if:  The death of your loved one was sudden or unexpected.  The death of your loved one was due to a violent event.  Your loved one died from suicide.  Your loved one was a child or a young person.  You were very close to your loved one, or you were dependent on him or her.  You have a history of depression or anxiety. What are the signs or symptoms? Symptoms of this condition include:  Feeling disbelief or having a lack of emotion (numbness).  Being unable to enjoy good memories of your loved one.  Needing to avoid anything or anyone that reminds you of your  loved one.  Being unable to stop thinking about the death.  Feeling intense anger or guilt.  Feeling alone and hopeless.  Feeling that your life is meaningless and empty.  Losing the desire to move on with your life. How is this diagnosed? This condition may be diagnosed based on:  Your symptoms. Complicated grief will be diagnosed if you have ongoing symptoms of grief for 6-12 months or longer.  The effect of symptoms on your life. You may be diagnosed with this condition if your symptoms are interfering with your ability to live your life. Your health care provider may recommend that you see a mental health care provider. Many symptoms of depression are similar to the symptoms of complicated grief. It is important to be evaluated for complicated grief along with other mental health conditions. How is this treated? This condition is most commonly treated with talk therapy. This therapy is offered by a mental health specialist (psychiatrist). During therapy:  You will learn healthy ways to cope with the loss of your loved one.  Your mental health care provider may recommend antidepressant medicines. Follow these instructions at home: Lifestyle   Take care of yourself. ? Eat on a regular basis, and maintain a healthy diet. Eat plenty of fruits, vegetables, lean protein, and whole grains. ? Try to get some exercise each day. Aim for 30 minutes of exercise on most days  of the week. ? Keep a consistent sleep schedule. Try to get 8 or more hours of sleep each night. ? Start doing the things that you used to enjoy.  Do not use drugs or alcohol to ease your symptoms.  Spend time with friends and loved ones. General instructions  Take over-the-counter and prescription medicines only as told by your health care provider.  Consider joining a grief (bereavement) support group to help you deal with your loss.  Keep all follow-up visits as told by your health care provider. This is  important. Contact a health care provider if:  Your symptoms prevent you from functioning normally.  Your symptoms do not get better with treatment. Get help right away if:  You have serious thoughts about hurting yourself or someone else.  You have suicidal feelings. If you ever feel like you may hurt yourself or others, or have thoughts about taking your own life, get help right away. You can go to your nearest emergency department or call:  Your local emergency services (911 in the [place]  A suicide crisis helpline, such as the National Suicide Prevention Lifeline at 918 555 57741-234-167-6252. This is open 24 hours a day. Summary  Complicated grief is a severe type of grief that lasts for a long time. This grief is not likely to go away on its own. Get the help you need.  Some griefs are more difficult than others and can cause this condition. You may need a certain type of treatment to help you recover if the loss of your loved one was sudden, violent, or due to suicide.  You may feel guilty about moving on with your life. Getting help does not mean that you are forgetting your loved one. It means that you are taking care of yourself.  Complicated grief is best treated with talk therapy. Medicines may also be prescribed.  Seek the help you need, and find support that will help you recover. This information is not intended to replace advice given to you by your health care provider. Make sure you discuss any questions you have with your health care provider. Document Released: 01/01/2005 Document Revised: 10/17/2016 Document Reviewed: 10/17/2016 Elsevier Interactive Patient Education  2019 [employer]

## 2018-01-28 ENCOUNTER — Encounter: Payer: Self-pay | Admitting: Family Medicine

## 2018-04-06 DIAGNOSIS — F411 Generalized anxiety disorder: Secondary | ICD-10-CM | POA: Insufficient documentation

## 2018-04-06 DIAGNOSIS — E785 Hyperlipidemia, unspecified: Secondary | ICD-10-CM | POA: Insufficient documentation

## 2018-04-08 ENCOUNTER — Other Ambulatory Visit: Payer: Self-pay | Admitting: Family Medicine

## 2018-04-08 ENCOUNTER — Encounter: Payer: Self-pay | Admitting: Family Medicine

## 2018-04-08 MED ORDER — CLONAZEPAM 0.5 MG PO TABS: 0.5000 mg | ORAL_TABLET | Freq: Two times a day (BID) | ORAL | 1 refills | Status: AC

## 2018-04-08 NOTE — Telephone Encounter (Signed)
Will forward to provider for review.

## 2018-05-16 ENCOUNTER — Encounter: Payer: Self-pay | Admitting: Family Medicine

## 2018-08-10 ENCOUNTER — Encounter: Payer: Self-pay | Admitting: Family Medicine

## 2018-08-21 ENCOUNTER — Encounter: Payer: Self-pay | Admitting: Family Medicine

## 2018-08-21 ENCOUNTER — Telehealth: Payer: Self-pay | Admitting: Family Medicine

## 2018-08-21 DIAGNOSIS — I272 Pulmonary hypertension, unspecified: Secondary | ICD-10-CM

## 2018-08-21 DIAGNOSIS — R7989 Other specified abnormal findings of blood chemistry: Secondary | ICD-10-CM

## 2018-08-21 DIAGNOSIS — E782 Mixed hyperlipidemia: Secondary | ICD-10-CM

## 2018-08-21 NOTE — Telephone Encounter (Signed)
Pt called. She made a Virtual appointment for physical on 8/19 with you and wants labs done before her appointment. She has moved to [place], [place] and wants her labs done at the [facility] [place]. The address for [facility] is: [address] [place], [place], [place], ph # [phone], fax [phone]. Thank you.

## 2018-08-21 NOTE — Telephone Encounter (Signed)
We will fax lab order over.  Should be done fasting.

## 2018-08-21 NOTE — Telephone Encounter (Signed)
Pt advised labs sent.

## 2018-08-26 LAB — LIPID PANEL
Cholesterol: 301 — AB (ref 0–200)
HDL: 65 (ref 35–70)
LDL Cholesterol: 213
Triglycerides: 105 (ref 40–160)

## 2018-08-26 LAB — CBC AND DIFFERENTIAL
HCT: 41 (ref 36–46)
Hemoglobin: 13.8 (ref 12.0–16.0)
Platelets: 223 (ref 150–399)
WBC: 4.8

## 2018-08-26 LAB — BASIC METABOLIC PANEL
BUN: 15 (ref 4–21)
Creatinine: 0.8 (ref 0.5–1.1)
Potassium: 4.4 (ref 3.4–5.3)
Sodium: 136 — AB (ref 137–147)

## 2018-08-26 LAB — HEPATIC FUNCTION PANEL
ALT: 16 (ref 7–35)
AST: 16 (ref 13–35)
Alkaline Phosphatase: 43 (ref 25–125)
Bilirubin, Total: 1.1

## 2018-09-03 ENCOUNTER — Encounter: Payer: Self-pay | Admitting: Family Medicine

## 2018-09-03 ENCOUNTER — Telehealth (INDEPENDENT_AMBULATORY_CARE_PROVIDER_SITE_OTHER): Payer: Managed Care, Other (non HMO) | Admitting: Family Medicine

## 2018-09-03 VITALS — BP 113/76 | HR 53 | Temp 97.3°F | Ht 65.5 in | Wt 155.0 lb

## 2018-09-03 DIAGNOSIS — M25522 Pain in left elbow: Secondary | ICD-10-CM | POA: Diagnosis not present

## 2018-09-03 DIAGNOSIS — Z6825 Body mass index (BMI) 25.0-25.9, adult: Secondary | ICD-10-CM

## 2018-09-03 DIAGNOSIS — G2579 Other drug induced movement disorders: Secondary | ICD-10-CM

## 2018-09-03 DIAGNOSIS — F3281 Premenstrual dysphoric disorder: Secondary | ICD-10-CM

## 2018-09-03 DIAGNOSIS — Z0000 Encounter for general adult medical examination without abnormal findings: Secondary | ICD-10-CM

## 2018-09-03 MED ORDER — FLUOXETINE HCL 10 MG PO CAPS: ORAL_CAPSULE | ORAL | 1 refills | Status: DC

## 2018-09-03 NOTE — Progress Notes (Signed)
Virtual Visit  via Video Note  I connected with      Dana Gomez by a video enabled telemedicine application and verified that I am speaking with the correct person using two identifiers.   I discussed the limitations of evaluation and management by telemedicine and the availability of in person appointments. The patient expressed understanding and agreed to proceed.  History of Present Illness: Dana Gomez is a 43 y.o. female who would like to discuss physical.  Patient exercises regularly and tries to eat a healthy diet.  She feels as though she is in pretty good shape. Patient notes that she has been having episodes of worsening depression that is intermittent and linked to menstruation.  She is done some reading and is concerned she may have premenstrual dysphoric disorder.  She notes crying and fatigue.  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  She has been quite reluctant to use SSRIs in the past as result. Occurs often with menstrual cycle.  Feels different than chronic mild depression. In the past has tried OCPs. Already getting therapy. Lots of rumination.   Had been on other SSRI prior to Effexor.  Have tried zoloft, prozac which did not help a lot.    Allergy medicine: Had been on Singulair in the past. Switched to Zyrtec recently. Itchy eye.    Pain left medial elbow. Few months. Pain with flexion at night. No radiating pain or tingling. Tender at medial elbow. Feels like something needs to pop. Able to get the elbow to pop.   Patient had labs ordered prior to physical.  These were drawn on Tuesday but results were not yet available on my system.    Observations/Objective: BP 113/76   Pulse (!) 53   Temp (!) 97.3 F (36.3 C) (Oral)   Ht 5' 5.5" (1.664 m)   Wt 155 lb (70.3 kg)   BMI 25.40 kg/m  Wt Readings from Last 5 Encounters:  09/03/18 155 lb (70.3 kg)  01/22/18 155 lb (70.3 kg)  09/26/17 158 lb (71.7 kg)  08/28/17 154  lb 12.8 oz (70.2 kg)  04/18/17 153 lb (69.4 kg)   Exam: Appearance nontoxic appearing Normal Speech.  Psych alert and oriented normal speech thought process and affect.  No SI or HI expressed. Left elbow normal-appearing normal motion.    Assessment and Plan: 42 y.o. female with  We will do a.  Doing reasonably well.  Patient fundamentally is in pretty good health.  Labs are pending.  Continue healthy lifestyle and exercise.  Recommend mammography for breast cancer screening.  Cervical cancer screening and vaccines are up-to-date.  Mood: Patient has existing depression but likely has PMDD on top.  She does have a history of serotonin syndrome secondary to Effexor.  She had tolerated Prozac in the past.  After discussion plan to use cycling Prozac during luteal phase.  Use low-dose and can increase dose as needed.  Caution given history of serotonin syndrome.  Check back in about 1 month via video visit.  Allergies: After discussion plan to try Zyrtec and Zaditor eyedrops.  Elbow pain: Unclear etiology.  Patient is painful at medial epicondyles.  Discuss options including home exercise program for medial epicondylitis and pronators syndrome.  We will try that and if not better recommend follow back up with either myself or orthopedist/sports medicine doctor in [place].  PDMP not reviewed this encounter. No orders of the defined types were placed in this  encounter.  Meds ordered this encounter  Medications  . FLUoxetine (PROZAC) 10 MG capsule    Sig: Take daily on cycle day 14. Stop at first day of period.    Dispense:  90 capsule    Refill:  1    Follow Up Instructions:    I discussed the assessment and treatment plan with the patient. The patient was provided an opportunity to ask questions and all were answered. The patient agreed with the plan and demonstrated an understanding of the instructions.   The patient was advised to call back or seek an in-person evaluation if the  symptoms worsen or if the condition fails to improve as anticipated.  Time: 25 minutes of intraservice time, with >39 minutes of total time during today's visit.      Historical information moved to improve visibility of documentation.  Past Medical History:  Diagnosis Date  . Asthma   . Hyperlipidemia   . MDD (major depressive disorder) 09/27/2017  . 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 NASAL SINUS SURGERY  1999  . schleral buckle  [id number],2005   Social History   Tobacco Use  . Smoking status: Never Smoker  . Smokeless tobacco: Never Used  Substance Use Topics  . Alcohol use: Yes    Alcohol/week: 2.0 standard drinks    Types: 2 Glasses of wine per week    Comment: occ   family history includes Alcoholism in her father and mother; Breast cancer in her maternal grandmother; Hyperlipidemia in her mother; Hypertension in her mother.  Medications: Current Outpatient Medications  Medication Sig Dispense Refill  . cetirizine (ZYRTEC) 10 MG chewable tablet Chew 10 mg by mouth daily.    . clonazePAM (KLONOPIN) 0.5 MG tablet Take 1 tablet (0.5 mg total) by mouth 2 (two) times daily. 60 tablet 1  . Cholecalciferol (VITAMIN D PO) Take 6,000 Units by mouth daily.     [name]. FLUoxetine (PROZAC) 10 MG capsule Take daily on cycle day 14. Stop at first day of period. 90 capsule 1   No current facility-administered medications for this visit.    Allergies  Allergen Reactions  . Effexor [Venlafaxine] Other (See Comments)    Serotonin Syndrome  . Azithromycin Rash  . Erythromycin Rash

## 2018-09-03 NOTE — Patient Instructions (Addendum)
Thank you for coming in today.  Plan to start Prozac 10 mg daily during luteal phase.  Recommend Zyrtec and Zaditor eyedrops.  Rehab for possible medial epicondylitis or pronator syndrome.   Golfer's Elbow Rehab Ask your health care provider which exercises are safe for you. Do exercises exactly as told by your health care provider and adjust them as directed. It is normal to feel mild stretching, pulling, tightness, or discomfort as you do these exercises. Stop right away if you feel sudden pain or your pain gets worse. Do not begin these exercises until told by your health care provider. Stretching and range-of-motion exercises These exercises warm up your muscles and joints and improve the movement and flexibility of your elbow. Wrist extension  1. Straighten your left / right elbow in front of you with your palm facing up toward the ceiling. ? If told by your health care provider, bend your left / right elbow to a 90-degree angle (right angle) at your side. 2. With your other hand, gently pull your left / right hand and fingers toward the floor (extension). Stop when you feel a gentle stretch on the palm side of your forearm. 3. Hold this position for __________ seconds. Repeat __________ times. Complete this exercise __________ times a day. Wrist flexion  1. Straighten your left / right elbow in front of you with your palm facing down toward the floor. ? If told by your health care provider, bend your left / right elbow to a 90-degree angle (right angle) at your side. 2. With your other hand, gently push over the back of your left / right hand so your fingers point toward the floor (flexion). Stop when you feel a gentle stretch on the back of your forearm. 3. Hold this position for __________ seconds. Repeat __________ times. Complete this exercise __________ times a day. Forearm rotation, supination 1. Sit or stand with your elbows at your side. 2. Bend your left / right elbow to a  90-degree angle (right angle). 3. Using your uninjured hand, turn your left / right palm up toward the ceiling (supination) until you feel a gentle stretch along the inside of your forearm. 4. Hold this position for __________ seconds. Repeat __________ times. Complete this exercise __________ times a day. Forearm rotation, pronation 1. Sit or stand with your elbows at your side. 2. Bend your left / right elbow to a 90-degree angle (right angle). 3. Using your uninjured hand, turn your left / right palm down toward the floor (pronation) until you feel a gentle stretch along the top of your forearm. 4. Hold this position for __________ seconds. Repeat __________ times. Complete this exercise __________ times a day. Strengthening exercises These exercises build strength and endurance in your elbow. Endurance is the ability to use your muscles for a long time, even after they get tired. Wrist flexion  1. Sit with your left / right forearm supported on a table or other surface and your palm turned up toward the ceiling. Let your left / right wrist extend over the edge of the surface. 2. Hold a __________ weight or a piece of rubber exercise band or tubing. ? If using a rubber exercise band or tubing, hold the other end of the tubing with your other hand. 3. Slowly bend your wrist so your hand moves up toward the ceiling (flexion). Try to only move your wrist and keep the rest of your arm still. 4. Hold this position for __________ seconds. 5. Slowly return  to the starting position. Repeat __________ times. Complete this exercise __________ times a day. Wrist flexion, eccentric 1. Sit with your left / right forearm palm-up and supported on a table or other surface. Let your left / right wrist extend over the edge of the surface. 2. Hold a __________ weight or a piece of rubber exercise band or tubing in your left / right hand. ? If using a rubber exercise band or tubing, hold the other end of the  tubing with your other hand. 3. Use your uninjured hand to move your left / right hand up toward the ceiling. 4. Take your uninjured hand away and slowly return to the starting position using only your left / right hand (eccentric flexion). Repeat __________ times. Complete this exercise __________ times a day. Forearm rotation, pronation To do this exercise, you will need a lightweight hammer or rubber mallet. 1. Sit with your left / right forearm supported on a table or other surface. Bend your elbow to a 90-degree angle (right angle). Position your forearm so that your palm is facing up toward the ceiling, with your hand resting over the edge of the table. 2. Hold a hammer in your left / right hand. ? To make this exercise easier, hold the hammer near the head of the hammer. ? To make this exercise harder, hold the hammer near the end of the handle. 3. Without moving your elbow, slowly turn (rotate) your forearm so your palm faces down toward the floor (pronation). 4. Hold this position for __________ seconds. 5. Slowly return to the starting position. Repeat __________ times. Complete this exercise __________ times a day. Shoulder blade squeeze 1. Sit in a stable chair or stand with good posture. If you are sitting down, do not let your back touch the back of the chair. 2. Your arms should be at your sides with your elbows bent to a 90-degree angle (right angle). Position your forearms so that your thumbs are facing the ceiling (neutral position). 3. Without lifting your shoulders up, squeeze your shoulder blades tightly together. 4. Hold this position for __________ seconds. 5. Slowly release and return to the starting position. Repeat __________ times. Complete this exercise __________ times a day. This information is not intended to replace advice given to you by your health care provider. Make sure you discuss any questions you have with your health care provider. Document Released:  01/01/2005 Document Revised: 04/24/2018 Document Reviewed: 02/25/2018 Elsevier Patient Education  2020 [employer] your health care provider which exercises are safe for you. Do exercises exactly as told by your health care provider and adjust them as directed. It is normal to feel mild stretching, pulling, tightness, or discomfort as you do these exercises. Stop right away if you feel sudden pain or your pain gets worse. Do not begin these exercises until told by your health care provider. Stretching and range-of-motion exercises These exercises warm up your muscles and joints and improve the movement and flexibility of your forearm. The exercises also help to relieve pain, numbness, and tingling. Wrist range of motion 1. Bend your left / right elbow to a 90-degree angle (right angle). Position your forearm so that the thumb is facing the ceiling. This is the neutral position. 2. Bend your wrist so your fingers point toward your body. 3. Hold this position for __________ seconds. 4. Bend your wrist away from your body. 5. Hold this position for __________ seconds. Repeat __________ times. Complete  this exercise __________ times a day. Forearm rotation range of motion     1. Stand or sit with your left / right elbow bent in a 90-degree angle (right angle). Position your forearm so that the thumb is facing the ceiling. This is the neutral position. 2. Turn (rotate) your left / right forearm down toward the floor until you feel a gentle stretch on the outside of your forearm. 3. Hold this position for __________ seconds. 4. Rotate your left / right forearm up toward the ceiling until you feel a gentle stretch on the inside of your forearm. 5. Hold this position for __________ seconds. Repeat __________ times. Complete this exercise __________ times a day. Elbow range of motion 1. Sit in a chair without armrests or stand with your left / right arm at your side  and your forearm in a palm-down position. The palm should be facing behind you. 2. Bend your left / right elbow to bring the back of your hand toward your shoulder. 3. Hold this position for __________ seconds. 4. Slowly return to the starting position. Repeat __________ times. Complete this exercise __________ times a day. Wrist flexion, assisted  1. Bend your left / right arm at your side and turn your palm down toward the floor. ? If told by your health care provider, straighten your left / right elbow in front of you to increase the amount of stretch. 2. Using your uninjured hand, gently press over the back of your hand (assisted) to bend your wrist and fingers toward the floor (flexion). Go as far as you can to feel a stretch without causing pain. 3. Hold this position for __________ seconds. 4. Slowly return to the starting position. Repeat __________ times. Complete this exercise __________ times a day. Wrist extension, assisted  1. Bend your left / right arm at your side and turn your palm up toward the ceiling. ? If told by your health care provider, straighten your left / right elbow in front of you to increase the amount of stretch. 2. Using your uninjured hand, gently press over the palm of your hand (assisted) to bend your wrist and fingers toward the floor (extension). Go as far as you can to feel a stretch without causing pain. 3. Hold this position for __________ seconds. 4. Slowly return to the starting position. Repeat __________ times. Complete this exercise __________ times a day. Biceps stretch 1. Stand with your back to a sturdy chair. 2. Rest the back of your left / right hand on the back of the chair. Your elbow should be straight, and your palm should face the ceiling. 3. Slowly take 1-2 steps forward, stopping when you feel a gentle stretch in the top of your forearm or in your biceps. 4. Hold this position for __________ seconds. 5. Slowly return to the starting  position. Repeat __________ times. Complete this exercise __________ times a day. Median nerve slide 1. Stand with your left / right elbow bent to a 90-degree angle (right angle) and your palm facing down. 2. Use your other hand to gently bend your wrist backward, so your fingers point toward the ceiling. 3. Gently tilt your head so your left / right ear goes toward your left / right shoulder. As you tilt your head, allow your wrist to bend forward. 4. Hold this position for __________ seconds. 5. Slowly return to the starting position. Repeat __________ times. Complete this exercise __________ times a day. Median nerve glide Do not do this exercise  until told by your health care provider. Do not exercise to the point where you increase pain, numbness, or tingling. Stop at whichever position you start to feel tension in your arm or hand. You may not be able to do the full exercise right away. 1. Stand with your left / right elbow bent to a 90-degree angle (right angle) and your palm facing toward your belly. Maintain good posture throughout the exercise. 2. Sweep your left / right arm out in front of you, stopping when your arm is fully straight out at your side. Keep your wrist and fingers straight. 3. Hold this position for __________ seconds. 4. Keeping your arm straight, gently turn your palm up. 5. Hold this position for __________ seconds. 6. Keeping your arm straight and your palm up, gently tilt your head toward the opposite shoulder. 7. Hold this position for __________ seconds. 8. Slowly return to the starting position. Repeat __________ times. Complete this exercise __________ times a day. This information is not intended to replace advice given to you by your health care provider. Make sure you discuss any questions you have with your health care provider. Document Released: 01/01/2005 Document Revised: 04/25/2018 Document Reviewed: 02/19/2018 Elsevier Patient Education  2020  [employer]

## 2018-09-04 ENCOUNTER — Telehealth: Payer: Self-pay | Admitting: Family Medicine

## 2018-09-04 NOTE — Telephone Encounter (Signed)
Received labs from [facility] in [place]. Lipid panel was significantly abnormal.  LDL was 213. Glucose was 100.  Metabolic panel was otherwise normal.  CBC was normal with a hemoglobin of 13.8.  Labs will be abstracted.

## 2018-09-08 MED ORDER — ROSUVASTATIN CALCIUM 20 MG PO TABS: 20.0000 mg | ORAL_TABLET | Freq: Every day | ORAL | 1 refills | Status: DC

## 2018-09-08 NOTE — Addendum Note (Signed)
Addended by: Gregor Hams on: 09/08/2018 10:29 AM   Modules accepted: Orders

## 2018-09-08 NOTE — Telephone Encounter (Signed)
Crestor 20 mg sent to [facility] in [place].  Take daily.  Okay to take it morning or at night.  Can increase the dose to 40 mg or cut the pill in half to 10 mg.  We will want to check cholesterol in about 6 weeks.  I will set a reminder for myself. I was not able to add on the vitamin D to the first lab order last week.  I can get vitamin D checked at this next lab order.

## 2018-09-30 ENCOUNTER — Telehealth: Payer: Self-pay | Admitting: Family Medicine

## 2018-09-30 DIAGNOSIS — E782 Mixed hyperlipidemia: Secondary | ICD-10-CM

## 2018-09-30 DIAGNOSIS — Z5181 Encounter for therapeutic drug level monitoring: Secondary | ICD-10-CM

## 2018-09-30 DIAGNOSIS — E559 Vitamin D deficiency, unspecified: Secondary | ICD-10-CM

## 2018-09-30 NOTE — Telephone Encounter (Signed)
Received labs from [facility].  These labs were collected on August 11. Total cholesterol was 301 LDL cholesterol was 213 HDL 65 Triglycerides 161 Metabolic panel was normal with normal creatinine etc.  Hemoglobin was normal.  We should recheck these labs 6 weeks after starting Crestor (rosuvastatin).  Let me know if you have trouble tolerating rosuvastatin.  I will reorder cholesterol labs.

## 2018-10-15 ENCOUNTER — Encounter: Payer: Self-pay | Admitting: Family Medicine

## 2018-10-16 ENCOUNTER — Encounter: Payer: Self-pay | Admitting: Family Medicine

## 2018-10-16 LAB — CHLORIDE
ALBUMIN/GLOBULIN RATIO: 2.3
Albumin: 4.4
Calcium: 9.3
Carbon Dioxide, Total: 28
Chloride: 104
Globulin: 1.9
Total Protein: 6.3 g/dL

## 2018-10-21 ENCOUNTER — Encounter: Payer: Self-pay | Admitting: Family Medicine

## 2018-10-22 LAB — BASIC METABOLIC PANEL
BUN: 11 (ref 4–21)
Creatinine: 0.7 (ref 0.5–1.1)
Glucose: 107
Potassium: 4.8 (ref 3.4–5.3)
Sodium: 137 (ref 137–147)

## 2018-10-22 LAB — LIPID PANEL
Cholesterol: 188 (ref 0–200)
HDL: 88 — AB (ref 35–70)
LDL Cholesterol: 82
Triglycerides: 87 (ref 40–160)

## 2018-10-22 LAB — HEPATIC FUNCTION PANEL
ALT: 28 (ref 7–35)
AST: 23 (ref 13–35)
Alkaline Phosphatase: 47 (ref 25–125)
Bilirubin, Total: 1.3

## 2018-10-22 LAB — VITAMIN D 25 HYDROXY (VIT D DEFICIENCY, FRACTURES): Vit D, 25-Hydroxy: 45

## 2018-11-05 ENCOUNTER — Telehealth: Payer: Self-pay | Admitting: Family Medicine

## 2018-11-05 MED ORDER — FLUOXETINE HCL 10 MG PO CAPS: ORAL_CAPSULE | ORAL | 3 refills | Status: DC

## 2018-11-05 MED ORDER — ROSUVASTATIN CALCIUM 20 MG PO TABS: 20.0000 mg | ORAL_TABLET | Freq: Every day | ORAL | 3 refills | Status: AC

## 2018-11-05 NOTE — Telephone Encounter (Signed)
Received lab results from [facility] from October in a letter.  LDL is significantly better at 82.  Liver and kidney labs look fine. They went ahead and did process the vitamin D which is normal at 45.  Continue Crestor it is definitely working.  Your LDL (bad cholesterol) decreased from 213 to 82.   This is great.

## 2018-11-12 ENCOUNTER — Encounter: Payer: Self-pay | Admitting: Family Medicine

## 2018-11-12 MED ORDER — FLUTICASONE PROPIONATE 50 MCG/ACT NA SUSP: NASAL | 3 refills | Status: AC

## 2018-11-19 ENCOUNTER — Encounter: Payer: Self-pay | Admitting: Family Medicine

## 2018-11-19 DIAGNOSIS — Z1231 Encounter for screening mammogram for malignant neoplasm of breast: Secondary | ICD-10-CM

## 2018-11-20 ENCOUNTER — Other Ambulatory Visit: Payer: Self-pay

## 2018-11-20 DIAGNOSIS — Z1231 Encounter for screening mammogram for malignant neoplasm of breast: Secondary | ICD-10-CM

## 2018-12-04 DIAGNOSIS — G2579 Other drug induced movement disorders: Secondary | ICD-10-CM | POA: Insufficient documentation

## 2019-12-02 ENCOUNTER — Other Ambulatory Visit: Payer: Self-pay | Admitting: Family Medicine

## 2020-05-29 DIAGNOSIS — E785 Hyperlipidemia, unspecified: Secondary | ICD-10-CM | POA: Insufficient documentation

## 2021-01-18 ENCOUNTER — Telehealth: Payer: Self-pay | Admitting: Infectious Diseases

## 2021-01-18 NOTE — Telephone Encounter (Signed)
 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  She states that she is having severe weakness.  She got a cold in May a

## 2021-01-19 NOTE — Telephone Encounter (Signed)
Patient is calling to follow up on previous message. Please assist , thank you.

## 2021-01-23 ENCOUNTER — Telehealth: Payer: Self-pay | Admitting: Infectious Diseases

## 2021-01-23 NOTE — Telephone Encounter (Signed)
Spoke with patient;  She will fax me referral and medical history [phone]  Will schedule with dr blodget at [facility] 1/19 once medical records received.

## 2021-01-23 NOTE — Telephone Encounter (Signed)
Pt is calling to see if there are any appts before her 04/03/21  appt and she is asking if she can be put on a  wait/cancellation list. Pls call her to assist, Thanks.

## 2021-01-24 ENCOUNTER — Encounter: Payer: Self-pay | Admitting: Infectious Diseases

## 2021-01-24 DIAGNOSIS — R5382 Chronic fatigue, unspecified: Secondary | ICD-10-CM

## 2021-01-24 NOTE — Telephone Encounter (Signed)
RS patient to 02/09/2021 at 3:30 PM  Confirmed with patient  Referral under media

## 2021-02-03 ENCOUNTER — Other Ambulatory Visit: Admission: RE | Admit: 2021-02-03 | Discharge: 2021-02-03 | Disposition: A | Discharge status: Home Health | Payer: 59

## 2021-02-03 ENCOUNTER — Encounter: Payer: Self-pay | Admitting: Infectious Diseases

## 2021-02-03 ENCOUNTER — Ambulatory Visit: Payer: 59 | Attending: Infectious Diseases | Admitting: Infectious Diseases

## 2021-02-03 VITALS — BP 117/76 | HR 54 | Temp 98.1°F | Resp 18 | Ht 65.5 in | Wt 161.6 lb

## 2021-02-03 DIAGNOSIS — B2790 Infectious mononucleosis, unspecified without complication: Secondary | ICD-10-CM

## 2021-02-03 DIAGNOSIS — Z6826 Body mass index (BMI) 26.0-26.9, adult: Secondary | ICD-10-CM | POA: Insufficient documentation

## 2021-02-03 DIAGNOSIS — G9339 Other post infection and related fatigue syndromes: Secondary | ICD-10-CM

## 2021-02-03 LAB — CRP HS, BLOOD: C Reactive Protein, Ultrasensitive: 0.31 MG/DL (ref 0.00–1.00)

## 2021-02-03 MED ORDER — FLUOXETINE HCL 10 MG OR CAPS
ORAL_CAPSULE | ORAL | Status: DC
Start: 2020-12-29 — End: 2021-09-07

## 2021-02-03 MED ORDER — CETIRIZINE HCL 10 MG OR TABS
ORAL_TABLET | ORAL | Status: AC
Start: 2014-02-01 — End: ?

## 2021-02-03 MED ORDER — ROSUVASTATIN CALCIUM 10 MG OR TABS
ORAL_TABLET | ORAL | Status: DC
Start: 2019-09-02 — End: 2022-05-02

## 2021-02-03 NOTE — Consults (Signed)
 Subjective:   Shari Hudson is a 46 year old female   HPI  [name] with past medical history of hypercholesterolemia who was in usual state of health until May 2022 whe she had an episode of a upper respiratory tract infection.  This was during a

## 2021-02-04 LAB — HIV 1/2 ANTIBODY & P24 ANTIGEN ASSAY, BLOOD: HIV  1+2 Ab plus HIV 1 p24 Ant Scrn: NONREACTIVE

## 2021-02-04 LAB — SYPHILIS EIA SCREEN, BLOOD
T. Pallidum Ab: NONREACTIVE Index
T. Pallidum Ab: NOT DETECTED Index

## 2021-02-06 LAB — COCCIDIOIDES SCREEN, SERUM
Coccidioides Ab IgG, EIA: 0.033 (ref <1.5)
Coccidioides Ab IgM, EIA: 0.096 (ref <1.5)

## 2021-02-07 LAB — EPSTEIN BARR VIRUS PCR, QUAL: EBV DNA, PCR: NOT DETECTED

## 2021-02-07 LAB — CRYPTOCOCCAL ANTIGEN BLOOD: Cryptococcus Neoformans Ag Result: NEGATIVE

## 2021-02-09 ENCOUNTER — Ambulatory Visit: Payer: 59 | Admitting: Infectious Diseases

## 2021-02-16 ENCOUNTER — Inpatient Hospital Stay: Admit: 2021-02-16 | Discharge: 2021-02-16 | Disposition: A | Discharge status: Home Health | Payer: Self-pay

## 2021-02-23 ENCOUNTER — Ambulatory Visit
Admission: RE | Admit: 2021-02-23 | Discharge: 2021-02-23 | Disposition: A | Discharge status: Home / Self Care | Payer: 59 | Attending: Infectious Diseases | Admitting: Infectious Diseases

## 2021-02-23 DIAGNOSIS — R0602 Shortness of breath: Secondary | ICD-10-CM

## 2021-02-23 DIAGNOSIS — B2790 Infectious mononucleosis, unspecified without complication: Secondary | ICD-10-CM

## 2021-02-23 DIAGNOSIS — G9339 Other post infection and related fatigue syndromes: Secondary | ICD-10-CM

## 2021-02-23 MED ORDER — SODIUM CHLORIDE FLUSH 0.9 % IV SOLN
10.0000 mL | Freq: Once | INTRAVENOUS | Status: AC
Start: 2021-02-23 — End: 2021-02-23
  Administered 2021-02-23 (×2): 10 mL via INTRAVENOUS

## 2021-02-23 MED ORDER — PERFLUTREN LIPID MICROSPHERE 1.1 MG/ML IV SUSP
0.4000 mL | Freq: Once | INTRAVENOUS | Status: AC
Start: 2021-02-23 — End: 2021-02-24

## 2021-02-24 ENCOUNTER — Ambulatory Visit: Payer: 59 | Attending: Infectious Diseases | Admitting: Infectious Diseases

## 2021-02-24 ENCOUNTER — Encounter: Payer: Self-pay | Admitting: Infectious Diseases

## 2021-02-24 DIAGNOSIS — B2790 Infectious mononucleosis, unspecified without complication: Secondary | ICD-10-CM

## 2021-02-24 LAB — COMPLETE 2D ECHO WITH STRAIN
AO Mean Gradient: 4.8 mmHg
AO Peak Velocity: 1.49 m/s
Aortic Valve Area: 2.46 cm2
LA Volume Index: 25.8 ml/m2
LV Diastolic Diameter: 4.58 cm
Mitral Valve Area: 3.58 cm2
TR Velocity: 2.18 m/s

## 2021-02-24 NOTE — Progress Notes (Signed)
 Subjective:   Shari Hudson is a 46 year old female who is here for No chief complaint on file.      HPI  .Due to COVID-19 pandemic and a federally declared state of public health emergency, this telemedicine visit was conducted Audio+Video. The patient was in

## 2021-03-07 ENCOUNTER — Ambulatory Visit (INDEPENDENT_AMBULATORY_CARE_PROVIDER_SITE_OTHER): Payer: 59 | Admitting: Cardiology

## 2021-03-07 ENCOUNTER — Encounter: Payer: Self-pay | Admitting: Cardiology

## 2021-03-07 VITALS — BP 129/81 | HR 66 | Resp 18 | Ht 65.0 in | Wt 161.0 lb

## 2021-03-07 DIAGNOSIS — Q2112 Patent foramen ovale: Secondary | ICD-10-CM

## 2021-03-07 DIAGNOSIS — R079 Chest pain, unspecified: Secondary | ICD-10-CM

## 2021-03-07 DIAGNOSIS — I253 Aneurysm of heart: Secondary | ICD-10-CM

## 2021-03-07 DIAGNOSIS — R0789 Other chest pain: Secondary | ICD-10-CM

## 2021-03-07 DIAGNOSIS — R9431 Abnormal electrocardiogram [ECG] [EKG]: Secondary | ICD-10-CM

## 2021-03-07 LAB — ECG IN CLINIC
QRS INTERVAL/DURATION: 102 ms
R-R INTERVAL AVERAGE: 925 ms

## 2021-03-07 MED ORDER — FLUTICASONE PROPIONATE 50 MCG/ACT NA SUSP
1.0000 | Freq: Every day | NASAL | Status: DC
Start: ? — End: 2022-02-27

## 2021-03-07 NOTE — Progress Notes (Signed)
 [facility]: Clinician Documentation    [facility] - Dr. Horton Chin  Cardiology Consultation Note  Visit Date: 03/07/2021   Patient Name: Shari Hudson     PCP: Vear Clock       Reason for Visit: Consult    Dear Dr

## 2021-03-07 NOTE — Telephone Encounter (Signed)
 From: Cora Daniels  To: Ailin Horton Chin, MD  Sent: 03/07/2021 11:17 AM PST  Subject: Past Echos and EKGs    Hi Dr. B. I found some past echo results that might be useful in your analysis. We looked at the EKG together, but please let me know if these

## 2021-03-08 ENCOUNTER — Inpatient Hospital Stay: Admit: 2021-03-08 | Discharge: 2021-03-08 | Disposition: A | Discharge status: Home Health | Payer: Self-pay

## 2021-03-10 LAB — ECG IN CLINIC
P AXIS: 102 Deg
PR INTERVAL: 149 ms
QT: 401 ms
QTc (Bazett): 411 ms
R AXIS: -9 Deg
T AXIS: -11 Deg
VENTRICULAR RATE: 64 {beats}/min

## 2021-03-16 ENCOUNTER — Ambulatory Visit: Payer: 59 | Attending: Infectious Diseases | Admitting: Orthopaedic Surgery

## 2021-03-16 ENCOUNTER — Ambulatory Visit (HOSPITAL_BASED_OUTPATIENT_CLINIC_OR_DEPARTMENT_OTHER): Admit: 2021-03-16 | Discharge: 2021-03-16 | Disposition: A | Discharge status: Home Health | Payer: 59

## 2021-03-16 ENCOUNTER — Other Ambulatory Visit: Payer: Self-pay | Admitting: Orthopaedic Surgery

## 2021-03-16 ENCOUNTER — Encounter: Payer: Self-pay | Admitting: Orthopaedic Surgery

## 2021-03-16 VITALS — BP 120/85 | HR 85 | Temp 97.0°F | Resp 18 | Ht 65.0 in | Wt 160.9 lb

## 2021-03-16 DIAGNOSIS — M47812 Spondylosis without myelopathy or radiculopathy, cervical region: Secondary | ICD-10-CM

## 2021-03-16 DIAGNOSIS — M50322 Other cervical disc degeneration at C5-C6 level: Secondary | ICD-10-CM

## 2021-03-16 DIAGNOSIS — M4712 Other spondylosis with myelopathy, cervical region: Secondary | ICD-10-CM | POA: Insufficient documentation

## 2021-03-16 DIAGNOSIS — M542 Cervicalgia: Secondary | ICD-10-CM | POA: Insufficient documentation

## 2021-03-16 DIAGNOSIS — M4312 Spondylolisthesis, cervical region: Secondary | ICD-10-CM

## 2021-03-16 MED ORDER — GABAPENTIN 100 MG OR CAPS
100.0000 mg | ORAL_CAPSULE | Freq: Three times a day (TID) | ORAL | 0 refills | Status: DC
Start: 2021-03-16 — End: 2021-09-08

## 2021-03-16 MED ORDER — MELOXICAM 7.5 MG OR TABS
7.5000 mg | ORAL_TABLET | Freq: Every day | ORAL | 1 refills | Status: DC
Start: 2021-03-16 — End: 2021-03-30

## 2021-03-22 NOTE — Progress Notes (Signed)
 Fredrik Cove MD [facility]  Assistant Professor [address] [place], [place]  Division of Spine and Trauma Surgery [place], [place] [address]  [phone]  Department of Orthopaedic Surgery  Tel: [phone]   Fax: [phone]

## 2021-03-30 ENCOUNTER — Ambulatory Visit: Payer: 59 | Attending: Anesthesiology | Admitting: Anesthesiology

## 2021-03-30 ENCOUNTER — Encounter: Payer: Self-pay | Admitting: Anesthesiology

## 2021-03-30 VITALS — BP 124/76 | HR 61 | Temp 97.1°F

## 2021-03-30 DIAGNOSIS — M7918 Myalgia, other site: Secondary | ICD-10-CM | POA: Insufficient documentation

## 2021-03-30 DIAGNOSIS — Z79899 Other long term (current) drug therapy: Secondary | ICD-10-CM | POA: Insufficient documentation

## 2021-03-30 DIAGNOSIS — Z79891 Long term (current) use of opiate analgesic: Secondary | ICD-10-CM | POA: Insufficient documentation

## 2021-03-30 DIAGNOSIS — M47812 Spondylosis without myelopathy or radiculopathy, cervical region: Secondary | ICD-10-CM

## 2021-03-30 DIAGNOSIS — Z791 Long term (current) use of non-steroidal anti-inflammatories (NSAID): Secondary | ICD-10-CM | POA: Insufficient documentation

## 2021-03-30 DIAGNOSIS — Z881 Allergy status to other antibiotic agents status: Secondary | ICD-10-CM | POA: Insufficient documentation

## 2021-03-30 DIAGNOSIS — M5030 Other cervical disc degeneration, unspecified cervical region: Secondary | ICD-10-CM

## 2021-03-30 DIAGNOSIS — E785 Hyperlipidemia, unspecified: Secondary | ICD-10-CM | POA: Insufficient documentation

## 2021-03-30 MED ORDER — CELECOXIB 200 MG OR CAPS
200.0000 mg | ORAL_CAPSULE | Freq: Two times a day (BID) | ORAL | 0 refills | Status: DC
Start: 2021-03-30 — End: 2021-05-01

## 2021-03-30 MED ORDER — BUPIVACAINE HCL (PF) 0.25 % IJ SOLN
10.0000 mL | Freq: Once | INTRAMUSCULAR | Status: AC
Start: 2021-03-30 — End: 2021-03-30
  Administered 2021-03-30 (×2): 10 mL via INTRADERMAL

## 2021-03-30 MED ORDER — NALOXONE HCL 4 MG/0.1ML NA LIQD
NASAL | 0 refills | Status: DC
Start: 2021-03-30 — End: 2022-02-27

## 2021-03-30 NOTE — Progress Notes (Signed)
 [facility]  [place], [facility]    Initial Consultation    Date of Service: 03/30/2021  Referring Physician: Judi Cong  PCP: Vear Clock    Thank you for the opportunity to see your patient Shari Hudson

## 2021-03-30 NOTE — Procedures (Signed)
 PAIN TRIGGER POINT INJECTION  [facility]  [place], [place]    TRIGGER POINT INJECTION    Date of Procedure: 03/30/2021    Preoperative Diagnosis: Myalgia/Myositis 729.1  Postoperative Diagnosis: Myalgia/Myos

## 2021-04-02 ENCOUNTER — Encounter: Payer: Self-pay | Admitting: Orthopaedic Surgery

## 2021-04-03 ENCOUNTER — Ambulatory Visit: Payer: 59 | Admitting: Infectious Diseases

## 2021-04-27 ENCOUNTER — Other Ambulatory Visit: Payer: Self-pay | Admitting: Anesthesiology

## 2021-04-27 DIAGNOSIS — F1190 Opioid use, unspecified, uncomplicated: Secondary | ICD-10-CM

## 2021-04-27 DIAGNOSIS — M7918 Myalgia, other site: Secondary | ICD-10-CM

## 2021-04-27 DIAGNOSIS — M47812 Spondylosis without myelopathy or radiculopathy, cervical region: Secondary | ICD-10-CM

## 2021-04-27 DIAGNOSIS — M5030 Other cervical disc degeneration, unspecified cervical region: Secondary | ICD-10-CM

## 2021-04-28 ENCOUNTER — Ambulatory Visit: Payer: 59 | Admitting: Orthopaedic Surgery

## 2021-05-01 MED ORDER — CELECOXIB 200 MG OR CAPS
ORAL_CAPSULE | ORAL | 0 refills | Status: DC
Start: 2021-05-01 — End: 2021-09-08

## 2021-05-05 ENCOUNTER — Encounter: Payer: Self-pay | Admitting: Anesthesiology

## 2021-05-12 ENCOUNTER — Ambulatory Visit: Payer: 59 | Admitting: Anesthesiology

## 2021-06-26 ENCOUNTER — Ambulatory Visit (INDEPENDENT_AMBULATORY_CARE_PROVIDER_SITE_OTHER): Payer: 59

## 2021-06-26 DIAGNOSIS — Q2112 Patent foramen ovale: Secondary | ICD-10-CM

## 2021-06-26 DIAGNOSIS — I253 Aneurysm of heart: Secondary | ICD-10-CM

## 2021-06-26 NOTE — Interdisciplinary (Signed)
Patient arrived for Endopat Procedure. Patient confirmed identifications and the Endopat procedure. Patient was fasting as instructed. Explained the procedure process to patient and began preparing patient for procedure.     Patient was positioned comfortably, supine. Blood Pressure was checked before the procedure at 08:22AM,(BP 115/81 Pulse 59), and recorded in the chart. Occlusion cuff was placed on the left arm and probes on both hands/ index fingers. Ensure that there are NO leaks throughout the testing. Allow test to run on StandBy mode for . Began the Baseline testing at 0836am for 6 minutes recording, Occlusion was recorded at 0843am the cuff to for 6 min. Informed patient to remain relaxed and calm. Post occlusion recorded at 0849am.  Patient followed given instructions during the testing and tolerated the procedure well. Results for Endopat were uploaded to the chart/media accordingly, and routed to ordering provider as well.

## 2021-06-28 ENCOUNTER — Telehealth (INDEPENDENT_AMBULATORY_CARE_PROVIDER_SITE_OTHER): Payer: 59 | Admitting: Cardiology

## 2021-06-28 DIAGNOSIS — R0789 Other chest pain: Secondary | ICD-10-CM

## 2021-06-28 NOTE — Progress Notes (Signed)
[place]: Clinician Documentation    [facility] - Dr. Peggye Ley  Cardiology Consultation Note  Visit Date: Wednesday June 28, 2021  Patient Name: Shari Hudson     PCP: Marijo Conception       Reason for Visit: Consult    Dear Dr. Marijo Conception,     I had the pleasure of seeing your patient, Shari Hudson, in my cardiology clinic. As you are no doubt aware, this is a 46 year old female patient with a medical history significant for the following:     1. Chest pain, atypical  2. Abnormal ecg  -moderate/non-specific t wave abnormality on ECG dating back to 2017   3. Left back pain  -PE ruled out  -resolved after treatment of cervical spine pain  4. Fatigue and history of EBV/mono  -improved    Shari Hudson is here today for follow up for chest pain.     Patient has had extreme fatigue since 05/2021. It was attributed to mononucleosis infection mostly. Her back pain referred to the left chest has since been treated.     On prior visit, she reported a dull left chest pain that is intermittent lasts for "a while" ongoing for many years, decades even. She hasn't thought much of it. She feels like she feels a little "cramped" in the chest and tries maneuvers to improve it, like extending/stretching but they dont really change the symptoms. It is not debilitating. Last episode was about 2 weeks ago. She had a brief episode during the video call today. ECG with moderate t wave changes.    She completed the EndoPat and we are awaiting results.    She denies symptoms of lightheadedness, palpitations or syncope. She also denies symptoms of PND, orthopnea, or lower extremity edema.         PAST MEDICAL/SURGICAL HISTORY:   See pertinent history above and detail history in consult note and EPIC      FAMILY HISTORY:  Family History   Problem Relation Name Age of Onset   . Heart Disease Mother     . Cholesterol/Lipid Disorder Brother     No family history of premature heart disease.      SOCIAL  HISTORY:   Social History     Socioeconomic History   . Marital status: Divorced   Tobacco Use   . Smoking status: Never   . Smokeless tobacco: Never       HOME MEDICATIONS:  Current Outpatient Medications   Medication Sig Dispense Refill   . celecoxib (CELEBREX) 200 MG capsule TAKE 1 CAPSULE BY MOUTH 2 TIMES DAILY. 60 capsule 0   . cetirizine (ZYRTEC) 10 MG tablet      . FLUoxetine (PROZAC) 10 MG capsule TAKE 1.5 CAPSULE BY ORAL ROUTE EVERY DAY     . fluticasone propionate (FLONASE ALLERGY RELIEF) 50 MCG/ACT nasal spray Spray 1 spray into each nostril daily.     [name] gabapentin (NEURONTIN) 100 MG capsule Take 1 capsule (100 mg) by mouth 3 times daily. 90 capsule 0   . naloxone (NARCAN) 4 mg/0.1 mL nasal spray For suspected opioid overdose, call 911! Then spray once in one nostril. Repeat after 3 minutes if no or minimal response using a new spray in other nostril. Call 911! Tilt head and spray intranasally into one nostril as needed for respiratory depression. If patient does not respond or responds and then relapses, repeat using a new nasal spray every 3 minutes until emergency  medical assistance arrives. 1 each 0   . rosuvastatin (CRESTOR) 10 MG tablet        No current facility-administered medications for this visit.       ALLERGIES:  Allergies   Allergen Reactions   . Erythromycin Base Rash       REVIEW OF SYSTEMS:  Please see 12 point ROS in chart      OBJECTIVE:  Last filed vital signs:                       There is no height or weight on file to calculate BMI.    EXAM:  Gen: Alert, no apparent distress, pleasant affect and cooperative  Eyes: anicteric sclerae  Neck: No deformity  Pulmonary: Normal respiratory effort and rate. No cyanosis. No audible wheeze/stridor.  Cardiovascular: No peripheral edema. Normal facial flush.  MSK: Normal gait.  Psych: Intact judgement. Normal mood and affect. Recent memory intact        LABS:    No results found for: CREAT, K, CHOL, LDL, HDL, TRIG, BNP,  TROPI    02/14/2021 [facility]   troponin neg x 2  CMP normal    ECG:  2017 ECG reviewed by me: SR, nonspecfic t wave abnormality precordial leads  02/14/2021 [facility] SR, borderline LVH, borderline T wave abnormality lateral leads  03/07/2021: NSR, 2 mm R wave in V1, moderate t wave abnormality      EVENT MONITOR:       ECHO:  02/23/2021  Summary:   1. Normal left ventricular systolic function. The left ventricular ejection fraction is 60-65% by visual estimation.   2. Normal diastolic LV function.   3. Saline contrast bubble study was positive.   4. Atrial septal aneurysm.   5. Normal right ventricular size and systolic function.   6. No significant valvular dysfunction.      STRESS TEST:    CTA PE 02/14/2021 [facility]  No acute PE, clear lungs     PROBLEM LIST:    ICD-10-CM ICD-9-CM   1. Chest pain, atypical  R07.89 786.59       ?  ASSESSMENT:  1. Chest pain, atypical  -intermittent, chronic, ongoing for years, appears not always associated with recent exacerbation of left back pain  -unclear etiology, however, echo shows normal structure and function of left ventricle, PE ruled out by CTA. ECG with nonspecific t abnormality that is present as far back as 2017.   -if chest pain, appears to be ongoing, despite management of left back pain, can consider microvascular or endothelial dysfunction and part of the differential.   -resolved after treatment of her cervical spine pain, referred to the left chest  -not limiting, occurs rarely, self resolve, no change in intensity or frequency over years.     2. Abnormal ecg  -stable, unchanged for years. No acute findings.   -high level of exertion without limitation at baseline  -echo structure an function is normal both left and right heart  3. Patent foramen ovale, suspected by atrial septal aneurysm and + bubble study with small number of bubble passing from right to left  -incidental finding  -RV and RA normal size and function  4. Fatigue is constant, not typical of cardiac etiology    -resolved  ?  RECOMMENDATIONS:   behavioral counseling to avoid extended sedentary periods to avoid risk of DVT  Continue to work up and managed back pain and fatigue per PCP and specialists  Will f/u  for results of endopat    Will contact pt by next week regarding results of EndoPat    I spent greater than 20 minutes on the day of the encounter reviewing the patient's diagnostic tests, performing a history and physical exam, writing orders, educating the patient, coordinating patient care with the staff, and documenting in the electronic medical records system. This visit was performed and was assessed using a billing code consistent with the time and complexity of the medical issues and clinical decision.  Time was allotted for both direct patient interaction and education with patient and family, in addition to time after the visit for completion of plan and discussion with supporting medical staff team members. Time was spent perform relevant physical examination, counseling and educating the patient and/or family, communicating results of laboratory and/or imaging studies to the patient and/or family, discussing medical and/or surgical treatment options and discussing pathophysiology and natural course of disease, performing care coordination, and documenting into the medical record on the day of the encounter.  Patient's questions and concerns have been addressed. Verbal and written instructions was given to and reviewed with the patient. Patient endorsed verbal understanding.       This telemedicine visit was conducted Audio+Video. The patient was in [place] for the duration of the visit.  The patient consented to a virtual visit, understanding the risks, such as a limited examination, and was aware that an in-person visit was available to them. Total time spent was 5-10 minutes.

## 2021-06-29 ENCOUNTER — Ambulatory Visit: Payer: 59 | Admitting: Registered Dietitian

## 2021-07-03 ENCOUNTER — Encounter: Payer: Self-pay | Admitting: Cardiology

## 2021-07-05 NOTE — Telephone Encounter (Addendum)
Please advise 

## 2021-09-05 ENCOUNTER — Telehealth: Payer: Self-pay | Admitting: Cardiology

## 2021-09-05 NOTE — Telephone Encounter (Signed)
Patient requesting sooner appointment.     Reason for sooner appointment:  Patient states feeling fatigue, chest pains more often, only worse when breathing  deeply, indigestion issues upper GI     I checked all other providers schedule nothing available sooner, also advised patient if this becomes worsening to please go to her nearest ER     Unable to schedule next available Dr. Tanda Rockers schedule is completely full.    Please assist.

## 2021-09-06 ENCOUNTER — Telehealth: Payer: Self-pay | Admitting: Cardiology

## 2021-09-06 NOTE — Telephone Encounter (Signed)
[facility] Call Center Multiple Messages: 2nd call from patient, please see previous message from Last Message Date: 8/22. iPhone utilized yes.      I left voice message on urgent iphone regarding patient needing a sooner appointment.

## 2021-09-06 NOTE — Telephone Encounter (Signed)
Patient called to follow up on message below. Informed patient that because she is having chest pain we are to advise for her to call 911 or go to the nearest emergency room. However, patient states she was told in the past to call clinic regarding symptoms.    Called urgent iPhone and connected patient with Cic[facility] Duck for further assistance.    Thank you.

## 2021-09-06 NOTE — Telephone Encounter (Signed)
Please see other encounter note from today.    September 06, 2021  Shari Hudson, [name]     EL    09/06/21  4:37 PM  Note  Patient transferred on urgent iphone, patient is c/o chest pain and states the pain started Saturday and has remained, currently patient reports pain is 5/10, in addition to extreme fatigue, indigestion, and a sense of "fullness" cramping, and GI distress. Patient reports pain worsens with inhalation, and denies SOB. Patient reports 90% of the time the pain is on the left side, and 10% on the right side of chest.      Patient reports she has been taking OTC aleve, since Saturday. Per patient PCP advised her to follow up with cardiologist.      Informed Dr. Dortha Kern, patient informed Dr. Dortha Kern advised to go to ED or urgent care to be evaluated in person.      Patient requested soonest appt. with any cardiologist in [place]. Patient scheduled, and informed per Dr. Dortha Kern she advises patient go to the ED or urgent care today for evaluation. Patient expressed understanding.              RH    09/06/21  4:06 PM  Shari Hudson contacted Shari Hudson, [name]

## 2021-09-06 NOTE — Telephone Encounter (Addendum)
Patient transferred on urgent iphone, patient is c/o chest pain and states the pain started Saturday and has remained, currently patient reports pain is 5/10, in addition to extreme fatigue, indigestion, and a sense of "fullness" cramping, and GI distress. Patient reports pain worsens with inhalation, and denies SOB. Patient reports 90% of the time the pain is on the left side, and 10% on the right side of chest.     Patient reports she has been taking OTC aleve, since Saturday. Per patient PCP advised her to follow up with cardiologist.     Informed Dr. Dortha Kern, patient informed Dr. Dortha Kern advised to go to ED or urgent care to be evaluated in person.     Patient requested soonest appt. with any cardiologist in [place]. Patient scheduled, and informed per Dr. Dortha Kern she advises patient go to the ED or urgent care today for evaluation. Patient expressed understanding.

## 2021-09-07 ENCOUNTER — Other Ambulatory Visit
Admission: RE | Admit: 2021-09-07 | Discharge: 2021-09-07 | Disposition: A | Discharge status: Home / Self Care | Payer: 59 | Attending: Cardiology | Admitting: Cardiology

## 2021-09-07 ENCOUNTER — Ambulatory Visit (INDEPENDENT_AMBULATORY_CARE_PROVIDER_SITE_OTHER): Payer: 59 | Admitting: Cardiology

## 2021-09-07 VITALS — BP 119/80 | HR 59 | Ht 65.5 in | Wt 163.0 lb

## 2021-09-07 DIAGNOSIS — R079 Chest pain, unspecified: Secondary | ICD-10-CM | POA: Insufficient documentation

## 2021-09-07 DIAGNOSIS — R0789 Other chest pain: Secondary | ICD-10-CM

## 2021-09-07 LAB — COMPREHENSIVE METABOLIC PANEL, BLOOD
ALT: 34 U/L (ref 7–52)
AST: 26 U/L (ref 13–39)
Albumin: 4.4 G/DL (ref 3.7–5.3)
Alk Phos: 47 U/L (ref 34–104)
BUN: 15 mg/dL (ref 7–25)
Bilirubin, Total: 1 mg/dL (ref 0.0–1.4)
CO2: 28 mmol/L (ref 21–31)
Calcium: 9.4 mg/dL (ref 8.6–10.3)
Chloride: 103 mmol/L (ref 98–107)
Creat: 0.7 mg/dL (ref 0.6–1.2)
Electrolyte Balance: 6 mmol/L (ref 2–12)
Glucose: 86 mg/dL (ref 70–115)
Potassium: 4.2 mmol/L (ref 3.5–5.1)
Protein, Total: 6.9 G/DL (ref 6.0–8.3)
Sodium: 137 mmol/L (ref 136–145)
eGFR - high estimate: >60 (ref >59)
eGFR - low estimate: >60 (ref >59)

## 2021-09-07 LAB — CBC WITH DIFF, BLOOD
ANC automated: 3.4 10*3/uL (ref 2.0–8.1)
Basophils %: 0.9 %
Basophils Absolute: 0.1 10*3/uL (ref 0.0–0.2)
Eosinophils %: 1.3 %
Eosinophils Absolute: 0.1 10*3/uL (ref 0.0–0.5)
Hematocrit: 40.2 % (ref 34.0–44.0)
Hgb: 13.6 G/DL (ref 11.5–15.0)
Lymphocytes %: 35.8 %
Lymphocytes Absolute: 2.3 10*3/uL (ref 0.9–3.3)
MCH: 29.5 PG (ref 27.0–33.5)
MCHC: 33.9 G/DL (ref 32.0–35.5)
MCV: 86.9 FL (ref 81.5–97.0)
MPV: 8.3 FL (ref 7.2–11.7)
Monocytes %: 7.9 %
Monocytes Absolute: 0.5 10*3/uL (ref 0.0–0.8)
Neutrophils % (A): 54.1 %
PLT Count: 251 10*3/uL (ref 150–400)
RBC: 4.62 10*6/uL (ref 3.70–5.00)
RDW-CV: 12.7 % (ref 11.6–14.4)
White Bld Cell Count: 6.3 10*3/uL (ref 4.0–10.5)

## 2021-09-07 LAB — SED RATE, BLOOD: Sedimentation Rate: 7 MM/HR (ref 0–20)

## 2021-09-07 LAB — MAGNESIUM, BLOOD: Magnesium: 2.2 mg/dL (ref 1.9–2.7)

## 2021-09-07 LAB — BNP, BLOOD: BNP: 9 pg/mL (ref 0–100)

## 2021-09-07 LAB — CRP HS, BLOOD: C Reactive Protein, Ultrasensitive: 0.31 MG/DL (ref 0.00–1.00)

## 2021-09-07 LAB — THYROID CASCADE: TSH, Ultrasensitive: 1.043 u[IU]/mL (ref 0.450–4.120)

## 2021-09-07 MED ORDER — FLUOXETINE HCL 20 MG OR CAPS
20.0000 mg | ORAL_CAPSULE | Freq: Every day | ORAL | Status: AC
Start: 2021-07-14 — End: ?

## 2021-09-07 MED ORDER — NITROGLYCERIN 0.4 MG SL SUBL
0.4000 mg | SUBLINGUAL_TABLET | SUBLINGUAL | 0 refills | Status: DC | PRN
Start: 2021-09-07 — End: 2022-02-27

## 2021-09-08 ENCOUNTER — Ambulatory Visit: Payer: 59 | Attending: Infectious Diseases | Admitting: Infectious Diseases

## 2021-09-08 ENCOUNTER — Encounter: Payer: Self-pay | Admitting: Infectious Diseases

## 2021-09-08 VITALS — BP 130/86 | HR 73 | Temp 98.2°F | Resp 17 | Ht 65.5 in | Wt 162.5 lb

## 2021-09-08 DIAGNOSIS — G9339 Other post infection and related fatigue syndromes: Secondary | ICD-10-CM | POA: Insufficient documentation

## 2021-09-08 DIAGNOSIS — R079 Chest pain, unspecified: Secondary | ICD-10-CM

## 2021-09-08 DIAGNOSIS — Z6826 Body mass index (BMI) 26.0-26.9, adult: Secondary | ICD-10-CM

## 2021-09-08 LAB — GLYCOSYLATED HGB(A1C), BLOOD: Glycated Hgb, A1C: 4.9 % (ref 4.6–5.6)

## 2021-09-08 NOTE — Progress Notes (Signed)
Subjective :  Frances Ambrosino is a 46 year old female who is here for Follow Up (Other post infection and related fatigue syndromes/)      HPI  28  y o F who was seen by me for EBV and had improved fatigue but her chest pain has been persistent.  It is baseline a 2-3/10 but it has been chronic.  She states that the pain is worse on deep inspiration.  She has no fevers or chills.  She has no cough or shortness of breath.  Of note she was evaluated by cardiology yesterday and her EKG was reviewed and no concern for MI.  Of note her echocardiography in March showed a PFO but her cardiologist was not concerned.  She is here for further evaluation.     Issues discussed today:  Chest Pain    Review of Systems   Constitutional: Negative.    HENT: Negative.     Eyes: Negative.    Respiratory: Negative.     Cardiovascular:  Positive for chest pain and palpitations.   Gastrointestinal: Negative.    Endocrine: Negative.    Genitourinary: Negative.    Musculoskeletal: Negative.    Skin: Negative.    Allergic/Immunologic: Negative.    Neurological: Negative.    Hematological: Negative.    Psychiatric/Behavioral: Negative.          Current Outpatient Medications   Medication Sig Dispense Refill    cetirizine (ZYRTEC) 10 MG tablet       FLUoxetine (PROZAC) 20 MG capsule Take 1 capsule (20 mg) by mouth daily.      fluticasone propionate (FLONASE ALLERGY RELIEF) 50 MCG/ACT nasal spray Spray 1 spray into each nostril daily.      naloxone (NARCAN) 4 mg/0.1 mL nasal spray For suspected opioid overdose, call 911! Then spray once in one nostril. Repeat after 3 minutes if no or minimal response using a new spray in other nostril. Call 911! Tilt head and spray intranasally into one nostril as needed for respiratory depression. If patient does not respond or responds and then relapses, repeat using a new nasal spray every 3 minutes until emergency medical assistance arrives. 1 each 0    nitroGLYcerin (NITROSTAT) 0.4 MG SL tablet 1 tablet (0.4 mg)  by Sublingual route every 5 minutes as needed for Chest Pain. up to 3 tabs per episode. 90 each 0    rosuvastatin (CRESTOR) 10 MG tablet        No current facility-administered medications for this visit.     Allergies   Allergen Reactions    Erythromycin Base Rash       Reviewed patients pertinent information related to social history, past medical, past surgical, and family history.     Objective :  Vital signs: BP 130/86 (BP Location: Right arm, BP Patient Position: Sitting, BP cuff size: Regular)   Pulse 73   Temp 98.2 F (36.8 C) (Temporal)   Resp 17   Ht 5' 5.5" (1.664 m)   Wt 73.7 kg (162 lb 7.7 oz)   LMP 08/25/2021   BMI 26.63 kg/m     Physical Exam  Constitutional:       Appearance: She is normal weight.   HENT:      Head: Normocephalic and atraumatic.      Mouth/Throat:      Mouth: Mucous membranes are moist.      Pharynx: Oropharynx is clear.   Eyes:      Extraocular Movements: Extraocular movements intact.  Conjunctiva/sclera: Conjunctivae normal.      Pupils: Pupils are equal, round, and reactive to light.   Cardiovascular:      Rate and Rhythm: Normal rate and regular rhythm.      Pulses: Normal pulses.      Heart sounds: Normal heart sounds. No murmur heard.     No friction rub. No gallop.   Pulmonary:      Effort: Pulmonary effort is normal.      Breath sounds: Normal breath sounds. No wheezing, rhonchi or rales.   Abdominal:      General: Abdomen is flat. Bowel sounds are normal.   Musculoskeletal:         General: Normal range of motion.      Cervical back: Normal range of motion and neck supple.   Skin:     General: Skin is warm and dry.   Neurological:      General: No focal deficit present.      Mental Status: She is alert and oriented to person, place, and time.   Psychiatric:         Mood and Affect: Mood normal.          Assessment/Plan:    #Chest Pain    Overall do not feel that this is infectious in etiology given the normal ESR, CRP and white cell count.  In terms of long  COVID-19 will not be able to determine because she was vaccinated.  Agree with CT chest in order to determine etiology of chest pain.  EKG reviewed and it does not appear to be pericarditis.  Will continue to follow up with cardiology.  Do not feel that this is related to mononucleosis. She can rtc on an as needed basis.     Domanique was seen today for follow up.    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

## 2021-09-14 NOTE — Progress Notes (Signed)
[place] [place] [facility]: [facility]  Cardiology Consultation Note  Visit Date: Thursday September 07, 2021  Patient Name: Shari Hudson     PCP: Marijo Conception       Reason for Visit: Consult    Dear Dr. Marijo Conception,     I had the pleasure of seeing your patient, Shari Hudson, in my cardiology clinic. As you are no doubt aware, this is a 46 year old female patient with a medical history significant for the following:     1. Chest pain, atypical  2. Abnormal ecg  -moderate/non-specific t wave abnormality on ECG dating back to 2017   3. Left back pain  -PE ruled out  -resolved after treatment of cervical spine pain  4. Fatigue and history of EBV/mono  -improved    Shari Hudson is here today for follow up for chest pain.     Patient has had extreme fatigue since 05/2021. It was attributed to mononucleosis infection mostly. Her back pain referred to the left chest has since been treated.     On prior visit, she reported a dull left chest pain that is intermittent lasts for "a while" ongoing for many years, decades even. She hasn't thought much of it. She feels like she feels a little "cramped" in the chest and tries maneuvers to improve it, like extending/stretching but they dont really change the symptoms. It is not debilitating. Last episode was about 2 weeks ago. She had a brief episode during the video call today. ECG with moderate t wave changes.    She completed the EndoPat which was average.    09/07/21  She is a usual patient of Dr Orpah Melter, however seeing me today in light of recent and ongoing worsening of chest pain. Patient reports symptoms > 1 week now and was advised to go to the ED but refused. ECG showed chronic anterolateral ST changes. She denies any worsening with exertion. ED precautions provided again.     She denies symptoms of lightheadedness, palpitations or syncope. She also denies symptoms of PND, orthopnea, or lower extremity edema.     PAST  MEDICAL/SURGICAL HISTORY:   See pertinent history above and detail history in consult note and EPIC      FAMILY HISTORY:  Family History   Problem Relation Name Age of Onset   . Heart Disease Mother     . Cholesterol/Lipid Disorder Brother     No family history of premature heart disease.      SOCIAL HISTORY:   Social History     Socioeconomic History   . Marital status: Divorced   Tobacco Use   . Smoking status: Never   . Smokeless tobacco: Never       HOME MEDICATIONS:  Current Outpatient Medications   Medication Sig Dispense Refill   . cetirizine (ZYRTEC) 10 MG tablet      . FLUoxetine (PROZAC) 20 MG capsule Take 1 capsule (20 mg) by mouth daily.     . fluticasone propionate (FLONASE ALLERGY RELIEF) 50 MCG/ACT nasal spray Spray 1 spray into each nostril daily.     . naloxone (NARCAN) 4 mg/0.1 mL nasal spray For suspected opioid overdose, call 911! Then spray once in one nostril. Repeat after 3 minutes if no or minimal response using a new spray in other nostril. Call 911! Tilt head and spray intranasally into one nostril as needed for respiratory depression. If patient does not respond or responds and then relapses,  repeat using a new nasal spray every 3 minutes until emergency medical assistance arrives. 1 each 0   . nitroGLYcerin (NITROSTAT) 0.4 MG SL tablet 1 tablet (0.4 mg) by Sublingual route every 5 minutes as needed for Chest Pain. up to 3 tabs per episode. 90 each 0   . rosuvastatin (CRESTOR) 10 MG tablet        No current facility-administered medications for this visit.       ALLERGIES:  Allergies   Allergen Reactions   . Erythromycin Base Rash       REVIEW OF SYSTEMS:  Please see 12 point ROS in chart      OBJECTIVE:  Last filed vital signs:  Blood pressure (BP): 119/80  Heart Rate: 59           Height: 5' 5.5" (166.4 cm)  Weight: 73.9 kg (163 lb)  Body mass index is 26.71 kg/m.    EXAM:  Gen: Alert, no apparent distress, pleasant affect and cooperative  Eyes: anicteric sclerae  Neck: No  deformity  Pulmonary: Normal respiratory effort and rate. No cyanosis. No audible wheeze/stridor.  Cardiovascular: No peripheral edema. Normal facial flush.  MSK: Normal gait.  Psych: Intact judgement. Normal mood and affect. Recent memory intact        LABS:    Lab Results   Component Value Date    CREAT 0.7 09/07/2021    K 4.2 09/07/2021    BNP 9 09/07/2021       02/14/2021 [facility]   troponin neg x 2  CMP normal    ECG:  2017 ECG reviewed by me: SR, nonspecfic t wave abnormality precordial leads  02/14/2021 [facility] SR, borderline LVH, borderline T wave abnormality lateral leads  03/07/2021: NSR, 2 mm R wave in V1, moderate t wave abnormality      EVENT MONITOR:       ECHO:  02/23/2021  Summary:   1. Normal left ventricular systolic function. The left ventricular ejection fraction is 60-65% by visual estimation.   2. Normal diastolic LV function.   3. Saline contrast bubble study was positive.   4. Atrial septal aneurysm.   5. Normal right ventricular size and systolic function.   6. No significant valvular dysfunction.      STRESS TEST:    CTA PE 02/14/2021 [facility]  No acute PE, clear lungs     PROBLEM LIST:    ICD-10-CM ICD-9-CM   1. Chest pain, unspecified type  R07.9 786.50       ?  ASSESSMENT:  1. Chest pain, atypical  -intermittent, chronic, ongoing for years, appears not always associated with recent exacerbation of left back pain  -unclear etiology, however, echo shows normal structure and function of left ventricle, PE ruled out by CTA. ECG with nonspecific t abnormality that is present as far back as 2017.   -ECG shows chronic anterolateral ST changes, no new changes  -Labs and Coronary CTA  -  Orders Placed This Encounter   Procedures   . X-Ray Chest Frontal And Lateral   . CTA Coronary   . CBC w/ Diff Lavender   . Glycosylated Hgb(A1C), Blood Lavender   . BNP Lavender   . CRP HS, Blood Green Plasma Separator Tube   . Comprehensive Metabolic Panel   . Magnesium, Blood Green Plasma Separator Tube   . Thyroid Cascade  Green Plasma Separator Tube   . Sedimentation Rate (ESR), Blood Lavender   . ECG, In Clinic     2. Abnormal  ecg  -stable, unchanged for years. No acute findings.   -high level of exertion without limitation at baseline  -echo structure an function is normal both left and right heart    3. Patent foramen ovale, suspected by atrial septal aneurysm and + bubble study with small number of bubble passing from right to left  -incidental finding  -RV and RA normal size and function  4. Fatigue is constant, not typical of cardiac etiology   -resolved    Continue follow up with Dr Orpah Melter  ?  Raelyn Number, MD  Assistant Professor of Medicine  Advanced Heart Failure [facility] Circul[facility]    > 40 min was spent in total on pre-visit (reviewing notes, results, correspondence, and reports), visit (history from patient, ordering/referrals, counseling, planning) and post-visit (documenting, reviewing/communicating results, independent interpretation of results and care coordination). Activities for this visit may include all or some of the above. More than 50% of this visit was spent in educating the patient about their condition, counseling including answering all of the patients questions and coordination of care.

## 2021-09-15 LAB — ECG IN CLINIC
P AXIS: 104 Deg
PR INTERVAL: 148 ms
QRS INTERVAL/DURATION: 104 ms
QT: 428 ms
QTc (Bazett): 431 ms
R AXIS: -3 Deg
R-R INTERVAL AVERAGE: 978 ms
T AXIS: 7 Deg
VENTRICULAR RATE: 61 {beats}/min

## 2021-09-19 ENCOUNTER — Ambulatory Visit (HOSPITAL_BASED_OUTPATIENT_CLINIC_OR_DEPARTMENT_OTHER)
Admit: 2021-09-19 | Discharge: 2021-09-19 | Disposition: A | Discharge status: Home Health | Payer: 59 | Attending: Cardiology | Admitting: Cardiology

## 2021-09-19 ENCOUNTER — Ambulatory Visit
Admit: 2021-09-19 | Discharge: 2021-09-19 | Disposition: A | Discharge status: Home / Self Care | Payer: 59 | Attending: Diagnostic Radiology | Admitting: Diagnostic Radiology

## 2021-09-19 ENCOUNTER — Encounter: Payer: Self-pay | Admitting: Diagnostic Radiology

## 2021-09-19 DIAGNOSIS — I318 Other specified diseases of pericardium: Secondary | ICD-10-CM | POA: Insufficient documentation

## 2021-09-19 DIAGNOSIS — R9431 Abnormal electrocardiogram [ECG] [EKG]: Secondary | ICD-10-CM

## 2021-09-19 DIAGNOSIS — I208 Other forms of angina pectoris: Secondary | ICD-10-CM | POA: Insufficient documentation

## 2021-09-19 DIAGNOSIS — R079 Chest pain, unspecified: Secondary | ICD-10-CM

## 2021-09-19 MED ORDER — NITROGLYCERIN 0.4 MG SL SUBL
0.4000 mg | SUBLINGUAL_TABLET | SUBLINGUAL | Status: DC | PRN
Start: 2021-09-19 — End: 2021-09-23
  Administered 2021-09-19: 0.4 mg via SUBLINGUAL

## 2021-09-19 MED ORDER — METOPROLOL TARTRATE 5 MG/5ML IV SOLN
INTRAVENOUS | Status: AC
Start: 2021-09-19 — End: ?
  Filled 2021-09-19: qty 5

## 2021-09-19 MED ORDER — IOHEXOL 350 MG/ML IV SOLN
INTRAVENOUS | Status: AC
Start: 2021-09-19 — End: ?
  Filled 2021-09-19: qty 200

## 2021-09-19 MED ORDER — NITROGLYCERIN 0.4 MG SL SUBL
SUBLINGUAL_TABLET | SUBLINGUAL | Status: AC
Start: 2021-09-19 — End: ?
  Filled 2021-09-19: qty 25

## 2021-09-19 MED ORDER — IOHEXOL 350 MG/ML CO SOLN
75.0000 mL | Freq: Once | Status: AC
Start: 2021-09-19 — End: 2021-09-19
  Administered 2021-09-19: 60 mL via INTRAVENOUS

## 2021-09-19 MED ORDER — METOPROLOL TARTRATE 5 MG/5ML IV SOLN
5.0000 mg | INTRAVENOUS | Status: DC | PRN
Start: 2021-09-19 — End: 2021-09-23
  Filled 2021-09-19 (×2): qty 5

## 2021-09-19 NOTE — Nursing Note (Signed)
Patient arrived to CT. Patient name/DOB/MRN verified w/ patient. Patient medical history and allergies review.   Confirmed procedure with patient. Pt stated denied any caffeinated beverages within the past 24 hours.      Explained importance of staying still during the procedure.   Explained may experience headache or dizziness when administering IV medication and/or nitroglycerin under the tongue.  Discussed the importance of staying hydrated post procedure d/t the administration of IV contrast to protect the kidneys.  Reinforced to the pt to call 911 or go to the nearest ER if experienced any shortness of breath, chest pain or stroke-like symptoms.  Due to the medication used for procedure, advised pt to take it easy for the remainder of the day.     Pt verbalized understanding and agreed with the plan.      Pre-procedure  0950  Pt denies baseline chest pain, shortness of breath, and dizziness. VS WNL.        Dr.Abbona oversaw the study  Meds given:  0.4mg  nitroglycerin sublingual      Post-procedure  1030  Pt denies post procedure chest pain, shortness of breath, and dizziness. VS stable. Reinforced ER precautions.

## 2021-10-30 ENCOUNTER — Telehealth: Payer: Self-pay | Admitting: Cardiology

## 2021-10-30 NOTE — Telephone Encounter (Signed)
Patient needs to reschedule her appointment 11/29 to the week after.

## 2021-10-30 NOTE — Telephone Encounter (Signed)
Spoke with pt, we have reschedule her appointment for 12/20/21.    Pt was grateful for the called back.

## 2021-11-22 ENCOUNTER — Encounter: Payer: 59 | Admitting: Cardiology

## 2021-12-13 ENCOUNTER — Encounter: Payer: 59 | Admitting: Cardiology

## 2021-12-20 ENCOUNTER — Telehealth (INDEPENDENT_AMBULATORY_CARE_PROVIDER_SITE_OTHER): Payer: 59 | Admitting: Cardiology

## 2021-12-20 DIAGNOSIS — I253 Aneurysm of heart: Secondary | ICD-10-CM

## 2021-12-20 DIAGNOSIS — R0789 Other chest pain: Secondary | ICD-10-CM

## 2021-12-20 DIAGNOSIS — Q2112 Patent foramen ovale: Secondary | ICD-10-CM

## 2021-12-20 NOTE — Progress Notes (Signed)
[facility]: Clinician Documentation    [facility] - Dr. Horton Hudson  Cardiology Consultation Note  Visit Date: Wednesday December 20, 2021  Patient Name: Shari Hudson     PCP: Shari Hudson       Reason for Visit: Consult    Dear Dr. Vear Hudson,     I had the pleasure of seeing your patient, Shari Hudson, in my cardiology clinic. As you are no doubt aware, this is a 46 year old female patient with a medical history significant for the following:     1. Chest pain, atypical  -now resolved  2. Abnormal ecg  -moderate/non-specific t wave abnormality on ECG dating back to 2017   3. Left back pain  -PE ruled out  -resolved after treatment of cervical spine pain  4. Fatigue and history of EBV/mono  -improved    Shari Hudson is here today for follow up for chest pain starting 01/2021.     She had COVID19 05/2020. Patient has had extreme fatigue since 05/2020. CP started around 01/2021. She was seen by me 06/28/2021, and it got worse near 08/2021 while she was at a wedding out of state, and was seen by Shari Hudson. EndoPat was average on [place].     Now she is much better. CP is gone, muscle aches is resolved, fatigue is improved.    She suspects perhaps the constellation of her symptoms were related to long covid, although she never tested positive, she had typical symptoms and was exposed.      She denies symptoms of lightheadedness, palpitations or syncope. She also denies symptoms of PND, orthopnea, or lower extremity edema.         PAST MEDICAL/SURGICAL HISTORY:   See pertinent history above and detail history in consult note and EPIC      FAMILY HISTORY:  Family History   Problem Relation Name Age of Onset    Heart Disease Mother      Cholesterol/Lipid Disorder Brother     No family history of premature heart disease.      SOCIAL HISTORY:   Social History     Socioeconomic History    Marital status: Divorced   Tobacco Use    Smoking status: Never    Smokeless tobacco: Never        HOME MEDICATIONS:  Current Outpatient Medications   Medication Sig Dispense Refill    cetirizine (ZYRTEC) 10 MG tablet       FLUoxetine (PROZAC) 20 MG capsule Take 1 capsule (20 mg) by mouth daily.      fluticasone propionate (FLONASE ALLERGY RELIEF) 50 MCG/ACT nasal spray Spray 1 spray into each nostril daily.      naloxone (NARCAN) 4 mg/0.1 mL nasal spray For suspected opioid overdose, call 911! Then spray once in one nostril. Repeat after 3 minutes if no or minimal response using a new spray in other nostril. Call 911! Tilt head and spray intranasally into one nostril as needed for respiratory depression. If patient does not respond or responds and then relapses, repeat using a new nasal spray every 3 minutes until emergency medical assistance arrives. 1 each 0    nitroGLYcerin (NITROSTAT) 0.4 MG SL tablet 1 tablet (0.4 mg) by Sublingual route every 5 minutes as needed for Chest Pain. up to 3 tabs per episode. 90 each 0    rosuvastatin (CRESTOR) 10 MG tablet        No current facility-administered medications for this visit.  ALLERGIES:  Allergies   Allergen Reactions    Erythromycin Base Rash       REVIEW OF SYSTEMS:  Please see 12 point ROS in chart      OBJECTIVE:  Last filed vital signs:                       There is no height or weight on file to calculate BMI.    EXAM:  Gen: Alert, no apparent distress, pleasant affect and cooperative  Eyes: anicteric sclerae  Neck: No deformity  Pulmonary: Normal respiratory effort and rate. No cyanosis. No audible wheeze/stridor.  Cardiovascular: No peripheral edema. Normal facial flush.  MSK: Normal gait.  Psych: Intact judgement. Normal mood and affect. Recent memory intact        LABS:    Lab Results   Component Value Date    CREAT 0.7 09/07/2021    K 4.2 09/07/2021    BNP 9 09/07/2021       02/14/2021 [facility]   troponin neg x 2  CMP normal    ECG:  2017 ECG reviewed by me: SR, nonspecfic t wave abnormality precordial leads  02/14/2021 [facility] SR, borderline  LVH, borderline T wave abnormality lateral leads  03/07/2021: NSR, 2 mm R wave in V1, moderate t wave abnormality      EVENT MONITOR:       ECHO:  02/23/2021  Summary:   1. Normal left ventricular systolic function. The left ventricular ejection fraction is 60-65% by visual estimation.   2. Normal diastolic LV function.   3. Saline contrast bubble study was positive.   4. Atrial septal aneurysm.   5. Normal right ventricular size and systolic function.   6. No significant valvular dysfunction.       STRESS TEST:    CTA PE 02/14/2021 [facility]  No acute PE, clear lungs     PROBLEM LIST:    ICD-10-CM ICD-9-CM   1. Chest pain, atypical  R07.89 786.59   2. Patent foramen ovale with atrial septal aneurysm  Q21.12 745.5    I25.3          ?  ASSESSMENT:  1. Chest pain, atypical  -intermittent, chronic, ongoing for years, appears not always associated with recent exacerbation of left back pain  -unclear etiology, however, echo shows normal structure and function of left ventricle, PE ruled out by CTA. ECG with nonspecific t abnormality that is present as far back as 2017.   -if chest pain, appears to be ongoing, despite management of left back pain, can consider microvascular or endothelial dysfunction and part of the differential.   -resolved after treatment of her cervical spine pain, referred to the left chest  -not limiting, occurs rarely, self resolve, no change in intensity or frequency over years.   -EndoPat average on [place]  2. Abnormal ecg  -stable, unchanged for years. No acute findings.   -high level of exertion without limitation at baseline  -echo structure an function is normal both left and right heart  3. Patent foramen ovale, suspected by atrial septal aneurysm and + bubble study with small number of bubble passing from right to left  -incidental finding  -RV and RA normal size and function  4. Fatigue is constant, not typical of cardiac etiology   -resolved  ?  RECOMMENDATIONS:   Monitor symptoms. If there is  a reoccurrance of the CP, pt will follow up with [place]. Otherwise, f/u PRN    I  spent greater than 20 minutes on the day of the encounter reviewing the patient's diagnostic tests, performing a history and physical exam, writing orders, educating the patient, coordinating patient care with the staff, and documenting in the electronic medical records system. This visit was performed and was assessed using a billing code consistent with the time and complexity of the medical issues and clinical decision.  Time was allotted for both direct patient interaction and education with patient and family, in addition to time after the visit for completion of plan and discussion with supporting medical staff team members. Time was spent perform relevant physical examination, counseling and educating the patient and/or family, communicating results of laboratory and/or imaging studies to the patient and/or family, discussing medical and/or surgical treatment options and discussing pathophysiology and natural course of disease, performing care coordination, and documenting into the medical record on the day of the encounter.  Patient's questions and concerns have been addressed. Verbal and written instructions was given to and reviewed with the patient. Patient endorsed verbal understanding.       This telemedicine visit was conducted Audio+Video. The patient was in [place] for the duration of the visit.  The patient consented to a virtual visit, understanding the risks, such as a limited examination, and was aware that an in-person visit was available to them. Total time spent was 5-10 minutes.

## 2022-01-17 ENCOUNTER — Telehealth: Payer: Self-pay | Admitting: Cardiology

## 2022-01-17 NOTE — Telephone Encounter (Signed)
Patient requesting to have a video appointment in regards to some medication that she is currently taking (crestor) and would like to ask Dr.Barseghian if she still needs to be on it. Please assist and give patient a call back, thank you.

## 2022-01-19 NOTE — Telephone Encounter (Signed)
Called pt, left message to call back.  Per chart review, crestor is listed as historical med, not prescrived by Dr.Barseghian.

## 2022-01-25 NOTE — Telephone Encounter (Signed)
Scheduled pt first available video visit.  Called pt, left message with appt info, advised to call back if she cannot make the appointment.

## 2022-02-14 ENCOUNTER — Telehealth (INDEPENDENT_AMBULATORY_CARE_PROVIDER_SITE_OTHER): Payer: Commercial Managed Care - PPO | Admitting: Cardiology

## 2022-02-14 DIAGNOSIS — E785 Hyperlipidemia, unspecified: Secondary | ICD-10-CM

## 2022-02-14 NOTE — Progress Notes (Signed)
[facility]: Clinician Documentation    [facility] - Dr. Horton Chin  Cardiology Consultation Note  Visit Date: Wednesday February 14, 2022  Patient Name: Shari Hudson     PCP: Vear Clock       Reason for Visit: Consult    Dear Dr. Vear Clock,     I had the pleasure of seeing your patient, Shari Hudson, in my cardiology clinic. As you are no doubt aware, this is a 47 year old female patient with a medical history significant for the following:     1. Chest pain, atypical  -CTA negative  -now resolved  2. Abnormal ecg  -moderate/non-specific t wave abnormality on ECG dating back to 2017   3. Left back pain  -PE ruled out  -resolved after treatment of cervical spine pain  4. Fatigue and history of EBV/mono  -improved  5. HLD  LDL 213 -> 160 -> 101 (10 mg) -> LDL 155 (5 mg) 05/2021  -mother, brother have HLD  -on crestor started on 2020 (had rise in bilirubin and crestor decreased from 20mg  to 10mg )  -CTA with 0 CAC,   6. History of retinal detachment, has cataract    Shari Hudson is here today. She remains CP free. We reviewed her prior lipid results as per pt report: LDL 213 -> 160 -> 101 (10 mg) -> LDL 155 (5 mg) 05/2021  -mother, brother have HLD  -on crestor started on 2020 (had rise in bilirubin and crestor decreased from 20mg  to 10mg ). Under the care of her PCP, she has stopped crestor about 2 weeks ago with a plan to retest lipids in 2 weeks.    She had COVID19 05/2020. Patient has had extreme fatigue since 05/2020. CP started around 01/2021. She was seen by me 06/28/2021, and it got worse near 08/2021 while she was at a wedding out of state, and was seen by Dr. 06/2020. EndoPat was average on [place]. Now she is much better. CP is gone, muscle aches is resolved, fatigue is improved. She suspects perhaps the constellation of her symptoms were related to long covid, although she never tested positive, she had typical symptoms and was exposed.      She denies symptoms of  lightheadedness, palpitations or syncope. She also denies symptoms of PND, orthopnea, or lower extremity edema.         PAST MEDICAL/SURGICAL HISTORY:   See pertinent history above and detail history in consult note and EPIC      FAMILY HISTORY:  Family History   Problem Relation Name Age of Onset    Heart Disease Mother      Cholesterol/Lipid Disorder Brother     No family history of premature heart disease.      SOCIAL HISTORY:   Social History     Socioeconomic History    Marital status: Divorced   Tobacco Use    Smoking status: Never    Smokeless tobacco: Never       HOME MEDICATIONS:  Current Outpatient Medications   Medication Sig Dispense Refill    cetirizine (ZYRTEC) 10 MG tablet       FLUoxetine (PROZAC) 20 MG capsule Take 1 capsule (20 mg) by mouth daily.      fluticasone propionate (FLONASE ALLERGY RELIEF) 50 MCG/ACT nasal spray Spray 1 spray into each nostril daily.      naloxone (NARCAN) 4 mg/0.1 mL nasal spray For suspected opioid overdose, call 911! Then spray once in one nostril.  Repeat after 3 minutes if no or minimal response using a new spray in other nostril. Call 911! Tilt head and spray intranasally into one nostril as needed for respiratory depression. If patient does not respond or responds and then relapses, repeat using a new nasal spray every 3 minutes until emergency medical assistance arrives. 1 each 0    nitroGLYcerin (NITROSTAT) 0.4 MG SL tablet 1 tablet (0.4 mg) by Sublingual route every 5 minutes as needed for Chest Pain. up to 3 tabs per episode. 90 each 0    rosuvastatin (CRESTOR) 10 MG tablet        No current facility-administered medications for this visit.       ALLERGIES:  Allergies   Allergen Reactions    Erythromycin Base Rash       REVIEW OF SYSTEMS:  Please see 12 point ROS in chart      OBJECTIVE:  Last filed vital signs:                       There is no height or weight on file to calculate BMI.    EXAM:  Gen: Alert, no apparent distress, pleasant affect and  cooperative  Eyes: anicteric sclerae  Neck: No deformity  Pulmonary: Normal respiratory effort and rate. No cyanosis. No audible wheeze/stridor.  Cardiovascular: No peripheral edema. Normal facial flush.  MSK: Normal gait.  Psych: Intact judgement. Normal mood and affect. Recent memory intact        LABS:    Lab Results   Component Value Date    CREAT 0.7 09/07/2021    K 4.2 09/07/2021    BNP 9 09/07/2021       02/14/2021 [facility]   troponin neg x 2  CMP normal    ECG:  2017 ECG reviewed by me: SR, nonspecfic t wave abnormality precordial leads  02/14/2021 [facility] SR, borderline LVH, borderline T wave abnormality lateral leads  03/07/2021: NSR, 2 mm R wave in V1, moderate t wave abnormality      EVENT MONITOR:       ECHO:  02/23/2021  Summary:   1. Normal left ventricular systolic function. The left ventricular ejection fraction is 60-65% by visual estimation.   2. Normal diastolic LV function.   3. Saline contrast bubble study was positive.   4. Atrial septal aneurysm.   5. Normal right ventricular size and systolic function.   6. No significant valvular dysfunction.       STRESS TEST:    CTA PE 02/14/2021 [facility]  No acute PE, clear lungs     PROBLEM LIST:  No diagnosis found.        ?  ASSESSMENT:  1. Chest pain, atypical  -intermittent, chronic, ongoing for years, appears not always associated with recent exacerbation of left back pain  -unclear etiology, however, echo shows normal structure and function of left ventricle, PE ruled out by CTA. ECG with nonspecific t abnormality that is present as far back as 2017.   -if chest pain, appears to be ongoing, despite management of left back pain, can consider microvascular or endothelial dysfunction and part of the differential.   -resolved after treatment of her cervical spine pain, referred to the left chest  -not limiting, occurs rarely, self resolve, no change in intensity or frequency over years.   -EndoPat average on [place]  2. Abnormal ecg  -stable, unchanged for years.  No acute findings.   -high level of exertion without limitation at  baseline  -echo structure an function is normal both left and right heart  3. Patent foramen ovale, suspected by atrial septal aneurysm and + bubble study with small number of bubble passing from right to left  -incidental finding  -RV and RA normal size and function  4. Fatigue is constant, not typical of cardiac etiology   -resolved  ?  RECOMMENDATIONS:   Ok to proceed with plan for holding statin x 1 month, retest. If LDL >150 recommend restarting at 5mg , then restest in 30 days. if LDL greater than 180 will likely need 10mg . If dose increased further can retest in another 30 days.   Monitor symptoms for any CP.    I spent greater than 20 minutes on the day of the encounter reviewing the patient's diagnostic tests, performing a history and physical exam, writing orders, educating the patient, coordinating patient care with the staff, and documenting in the electronic medical records system. This visit was performed and was assessed using a billing code consistent with the time and complexity of the medical issues and clinical decision.  Time was allotted for both direct patient interaction and education with patient and family, in addition to time after the visit for completion of plan and discussion with supporting medical staff team members. Time was spent perform relevant physical examination, counseling and educating the patient and/or family, communicating results of laboratory and/or imaging studies to the patient and/or family, discussing medical and/or surgical treatment options and discussing pathophysiology and natural course of disease, performing care coordination, and documenting into the medical record on the day of the encounter.  Patient's questions and concerns have been addressed. Verbal and written instructions was given to and reviewed with the patient. Patient endorsed verbal understanding.       This telemedicine visit was  conducted Audio+Video. The patient was in [place] for the duration of the visit.  The patient consented to a virtual visit, understanding the risks, such as a limited examination, and was aware that an in-person visit was available to them. Total time spent was 11-20 minutes.

## 2022-02-23 ENCOUNTER — Telehealth: Payer: Self-pay

## 2022-02-23 NOTE — Telephone Encounter (Signed)
Pt has spot in her vision and states its also hazy . Needs triage call please.  Transferred to clinic.

## 2022-02-23 NOTE — Telephone Encounter (Signed)
c/o hazy spot, worse in bright light (fluctuates) at 4 o'clock OS x 6 mos. , inc. dryness OS>OD. Using AT bid OU.       Referred by oph Dr. Lidia Collum r/o cataract, retina Dr. Edyth Gunnels, who states retina is fine and was recommended to see ?neuro oph.       s/p full scleral buckle OD 2002   s/p partial scleral buckle OS 2005 (has restricted eye movement).   No double vision OU.  s/p PRK OU 2009    Will see Dr. Billey Chang 02/12 and will bring records or fax to  [phone].

## 2022-02-26 ENCOUNTER — Encounter: Payer: Self-pay | Admitting: Ophthalmology

## 2022-02-26 ENCOUNTER — Ambulatory Visit: Payer: Commercial Managed Care - PPO | Attending: Ophthalmology | Admitting: Ophthalmology

## 2022-02-26 DIAGNOSIS — H179 Unspecified corneal scar and opacity: Secondary | ICD-10-CM | POA: Insufficient documentation

## 2022-02-26 DIAGNOSIS — Z8669 Personal history of other diseases of the nervous system and sense organs: Secondary | ICD-10-CM | POA: Insufficient documentation

## 2022-02-26 DIAGNOSIS — H538 Other visual disturbances: Secondary | ICD-10-CM | POA: Insufficient documentation

## 2022-02-26 DIAGNOSIS — H2513 Age-related nuclear cataract, bilateral: Secondary | ICD-10-CM | POA: Insufficient documentation

## 2022-02-26 DIAGNOSIS — H04123 Dry eye syndrome of bilateral lacrimal glands: Secondary | ICD-10-CM | POA: Insufficient documentation

## 2022-02-26 DIAGNOSIS — Z9889 Other specified postprocedural states: Secondary | ICD-10-CM | POA: Insufficient documentation

## 2022-02-26 DIAGNOSIS — H40003 Preglaucoma, unspecified, bilateral: Secondary | ICD-10-CM | POA: Insufficient documentation

## 2022-02-26 NOTE — Progress Notes (Unsigned)
Neuro-Ophthalmology Consultation  [place] [place], [place]  Shari Patch, MD                                    Date of Evaluation: Tuesday February 27, 2022   PCP: Shari Hudson  Referring MD: Shari Hudson    Please see "Detailed Report" under my note in the Encounter tab for details regarding today's visit.      ICD-10-CM ICD-9-CM   1. Blurry vision, left eye  H53.8 368.8   2. Floater, vitreous, left  H43.392 379.24       History of Present Illness   Shari Hudson is a 47 year old female with a past medical history of high myopia, retinal detachments OU who presents today for Neuro-ophthalmologic evaluation for blurred spot of the left eye.     Seen by Shari Hudson with new area of visual abnormality in the left eye, onset earlier last year, which she describes as a blurred region in the left eye at the 4:00 oclock position. Glaucoma testing done 09/05/21 that was WNL aside from small nasal step OU. Patient was then seen by Retina MD (Shari Hudson) given her extensive retinal history who states retina was fine and felt neuro-ophthalmology referral was appropriate.      Seen by Shari Hudson 02/12 yesterday but given she was referred to neuro-ophthalmology she was added on to see me today 02/27/2022     Since onset she is aware of a spot in her left eye that is transparent, darker and causes some blurring just inferior/nasally of center. It is not large and comes and goes intermittently but is more noticeable in bright light and on blank/white walls. Does move along with her eye movements. Has been fairly stable and unchanged since onset. Not a loss of vision or balck spot and not siilar to prior retinal hx.   Also with dry eye and feeling grittiness intermittently.      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 No double vision OU.  s/p PRK OU  2009    Ancillary Data/Imaging     Ophthalmic Clinic Testing:  02/27/2022   OCT: Mild RNFL thinning - avg OD 74 and OS 76.   Mild, symmetric, and likely component of high myopia causing acquisition   difficulty.        HVF: Full visual field in the right and left eye. Prior nasal step not replicated on todays fields. The inferior nasal region of the right eye patient notes is from her RD. Left eye full but there is a small pixel just inferior and nasally that she missed today and prior. Small but in location similar to her reported symptoms.          HVF outside provider 08/2021:            Impression and Plan     Shari Hudson is a 48 year old female presents today for blurred spot in her vision in the left eye. Has seen primray ophthalmologist and retina specialist without clear etiology and therefore referred to see me today. Hx of extensive prior retinal surgery with scleral buckle OD 2002, OS 2005 and [place] OU 2009.  Visual blurred spot OS  Her description is very fitting of a vitreous floater. Small spot in the left eye gray/dark/blur that is semitransparent inferonasally @ 4 oclock. More pronounced with bright light/white walls, and moves in vision. She draws it on the amsler grid showing 1-2 boxes just adjacent fovea. Fundus exam with very small area that is ? Small exudate however not seen on OCT and small shadow on the OCT in the para foveal region of her mac OCT that would be fitting with a floater. This matches where she is symptomatic.     No neuro-ophthalmologic pathology causative of her symptoms. A small para foveal spot that is monocular is very unlikely to be neurologic in origin. Optic neuropathy would be very unusual to cause such a small well defined paracentral scotoma that is stable and fluctuating like this. Not central or centrocecal scotoma by description or testing. Additionally it is better explained by other mechanism. Longer discussion with patient today given this is the 4th  ophthalmology visit she has had for this. Reassurance provided and she will continue to follow up with her comprehensive ophthalmologist and retina provider.      - No further neuro-ophthalmologic workup indicated.   - Ongoing follow up with Dr. Christiane Hudson and Dr. Lyndel Hudson  - Non-concerning for neuro-ophthalmologic pathology  - Reviewed rational with patient and good discussion today      I offered Shari Hudson follow up with me if desired but this can be done on an as needed basis. Any changes in vision or symptoms and they were instructed to call or message our office to be seen sooner/scheduled for follow up.     Shari Cobb, MD  Neuro-Ophthalmology   Tuesday February 27, 2022  __________________________________  I reviewed and confirmed all history components, including the HPI; and any other elements performed by the technician.  Excluding the time spent for the procedure (test(s) interpretation), I spent more than 40 minutes of total time for the patient's care on the date of the service - including outside records review, history, examination, documentation of clinical information with dictation and review, interpreting testing, counseling the patient, and communication with other health care professionals.

## 2022-02-26 NOTE — Progress Notes (Signed)
Assessment & Plan      1. Blurry vision, bilateral  Patient referred by Shari Hudson for Neuro-ophth email  Patient was initially confused why she is seeing a cornea specialist today  Patiend defers dilation and testing today and requests referral to neuro-ophth    2. Glaucoma suspect, both eyes  3. Nuclear sclerotic cataract of both eyes  Followed By Shari Hudson    4. History of retinal detachment  Follows with Shari Shari Hudson     5. 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 Dry eye syndrome of both eyes  F/W Shari Hudson       Return for Neuro-ophth N/A Referral from Shari Hudson.     I reviewed and confirmed all history components, including the HPI; and any other elements performed by the technician.     Shari Howard, MD, MPH  Associate Professor of Ophthalmology  Cornea, Cataract, and Refractive Surgery    Subjective :    Chief Complaint    Blurred Vision          Shari Hudson is a 47 year old female     HPI       Blurred Vision    In both eyes.  It is worse throughout the day.  Associated symptoms include blurred vision.             Comments    47 yr old F Referred by oph Shari. Shari Hudson r/o cataract, retina Shari. 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  c/o hazy spot, worse in bright light (fluctuates) at 4 o'clock OS x 6 mos ongoing ,c/o dryness OS>OD.      s/p full scleral buckle OD 2002 ,s/p partial scleral buckle OS 2005 (has restricted eye movement).s/p [place] OU 2009    Drops:Using AT bid OU          Last edited by Shari Hudson on 02/26/2022  3:16 PM.                History reviewed. No pertinent past medical history.    Ocular History            None             No past surgical history on file.     No current outpatient medications on file. (Ophthalmic Drugs)      No current facility-administered medications for this visit. (Ophthalmic Drugs)     Current Outpatient Medications (Other)   Medication Sig    cetirizine (ZYRTEC) 10 MG tablet     FLUoxetine (PROZAC) 20 MG capsule Take 1 capsule (20 mg) by mouth daily.    fluticasone propionate (FLONASE ALLERGY RELIEF) 50 MCG/ACT nasal spray Spray 1 spray into each nostril daily.    naloxone (NARCAN) 4 mg/0.1 mL nasal spray For suspected opioid overdose, call 911! Then spray once in one nostril. Repeat after 3 minutes if no or minimal response using a new spray in other nostril. Call 911! Tilt head and spray intranasally into one nostril as needed for respiratory depression. If patient does not respond or responds and then relapses, repeat using a new nasal spray every 3 minutes until emergency medical assistance arrives.    nitroGLYcerin (NITROSTAT) 0.4 MG SL tablet 1 tablet (0.4  mg) by Sublingual route every 5 minutes as needed for Chest Pain. up to 3 tabs per episode.    rosuvastatin (CRESTOR) 10 MG tablet      No current facility-administered medications for this visit. (Other)        Allergies   Allergen Reactions    Erythromycin Base Rash

## 2022-02-27 ENCOUNTER — Ambulatory Visit
Payer: 59 | Attending: Student in an Organized Health Care Education/Training Program | Admitting: Student in an Organized Health Care Education/Training Program

## 2022-02-27 ENCOUNTER — Encounter: Payer: Self-pay | Admitting: Student in an Organized Health Care Education/Training Program

## 2022-02-27 DIAGNOSIS — H43392 Other vitreous opacities, left eye: Secondary | ICD-10-CM | POA: Insufficient documentation

## 2022-02-27 DIAGNOSIS — H53412 Scotoma involving central area, left eye: Secondary | ICD-10-CM | POA: Insufficient documentation

## 2022-02-27 DIAGNOSIS — H538 Other visual disturbances: Secondary | ICD-10-CM | POA: Insufficient documentation

## 2022-04-24 ENCOUNTER — Encounter: Payer: Self-pay | Admitting: Cardiology

## 2022-04-24 DIAGNOSIS — E785 Hyperlipidemia, unspecified: Secondary | ICD-10-CM

## 2022-04-26 NOTE — Telephone Encounter (Deleted)
From: Shari Hudson  To: Ailin Barseghian El-Farra  Sent: 04/24/2022 2:11 PM PDT  Subject: Crestor dosage    As discussed, I've been on 5mg  of crestor (rosuvastatin) for a while and my cholesterol test results are attached. Do you recommend staying at 5mg  or raising to 10mg ? Thanks.

## 2022-05-02 MED ORDER — ROSUVASTATIN CALCIUM 10 MG OR TABS
10.0000 mg | ORAL_TABLET | Freq: Every day | ORAL | 3 refills | Status: AC
Start: 2022-05-02 — End: ?

## 2022-05-02 NOTE — Telephone Encounter (Signed)
Spoke with pt, she has been schedule for her follow up.

## 2023-02-05 ENCOUNTER — Encounter: Payer: Self-pay | Admitting: Cardiology

## 2023-03-25 ENCOUNTER — Encounter: Payer: Self-pay | Admitting: Cardiology

## 2023-03-26 NOTE — Telephone Encounter (Signed)
Called pt, left her a voicemail message regarding her message below.

## 2023-03-27 ENCOUNTER — Telehealth: Payer: Self-pay | Admitting: Cardiology

## 2023-03-27 NOTE — Telephone Encounter (Signed)
 Spoke with pt, and we have cancelled her appointment for April as she is doing well and does not need this appointment.    Pt was grateful for the called back.

## 2023-03-27 NOTE — Telephone Encounter (Signed)
 Patient calling returning Claudia's call  Please assist

## 2023-05-01 ENCOUNTER — Ambulatory Visit: Payer: 59 | Admitting: Cardiology
